# Patient Record
Sex: Female | Born: 1935 | Race: White | Hispanic: No | Smoking: Never smoker
Health system: Southern US, Community
[De-identification: ages and names within clinical notes are randomized; demographics above are authoritative.]

## PROBLEM LIST (undated history)

## (undated) DIAGNOSIS — E7800 Pure hypercholesterolemia, unspecified: Secondary | ICD-10-CM

## (undated) DIAGNOSIS — I10 Essential (primary) hypertension: Secondary | ICD-10-CM

## (undated) DIAGNOSIS — E119 Type 2 diabetes mellitus without complications: Secondary | ICD-10-CM

## (undated) DIAGNOSIS — F445 Conversion disorder with seizures or convulsions: Secondary | ICD-10-CM

## (undated) DIAGNOSIS — G629 Polyneuropathy, unspecified: Secondary | ICD-10-CM

## (undated) DIAGNOSIS — E785 Hyperlipidemia, unspecified: Secondary | ICD-10-CM

## (undated) DIAGNOSIS — F0390 Unspecified dementia without behavioral disturbance: Secondary | ICD-10-CM

## (undated) DIAGNOSIS — N289 Disorder of kidney and ureter, unspecified: Secondary | ICD-10-CM

## (undated) DIAGNOSIS — E559 Vitamin D deficiency, unspecified: Secondary | ICD-10-CM

## (undated) HISTORY — DX: Essential (primary) hypertension: I10

## (undated) HISTORY — DX: Polyneuropathy, unspecified: G62.9

## (undated) HISTORY — PX: ABDOMINAL HYSTERECTOMY: SHX81

## (undated) HISTORY — PX: APPENDECTOMY: SHX54

---

## 2017-04-17 ENCOUNTER — Ambulatory Visit: Payer: Medicare HMO | Admitting: Neurology

## 2017-04-17 ENCOUNTER — Encounter: Payer: Self-pay | Admitting: Neurology

## 2017-04-17 DIAGNOSIS — F0391 Unspecified dementia with behavioral disturbance: Secondary | ICD-10-CM

## 2017-04-17 DIAGNOSIS — F03B18 Unspecified dementia, moderate, with other behavioral disturbance: Secondary | ICD-10-CM

## 2017-04-17 MED ORDER — SERTRALINE HCL 100 MG PO TABS: 100.0000 mg | ORAL_TABLET | Freq: Every day | ORAL | 6 refills | Status: AC

## 2017-04-17 NOTE — Progress Notes (Signed)
NEUROLOGY CONSULTATION NOTE  Veronica Rivas MRN: [id number] DOB: 04-21-1927  Referring provider: Dr. Zannie Kehr Primary care provider: none listed  Reason for consult:  Establish care for dementia  Dear Dr Aileen Fass:  Thank you for your kind referral of Veronica Rivas for consultation of the above symptoms. Although her history is well known to you, please allow me to reiterate it for the purpose of our medical record. The patient was accompanied to the clinic by her daughter who also provides collateral information. Records and images were personally reviewed where available.  HISTORY OF PRESENT ILLNESS: This is an 83 year old right-handed woman with a history of hypertension, hyperlipidemia, neuropathy, and dementia, presenting to establish care. She moved to [facility] from [place] last March to be closer to her daughter. Records from her prior neurologist in [place] were reviewed. Family started noticing memory changes in 2013. She was evaluated in 2015, MOCA 22/30. MRI report from January 2015 noted chronic microvascular disease, no acute changes. She was started on Aricept. She had diarrhea on 10mg  dose, Namenda was added. In 2016, she was taking Aricept 10mg  daily and Namenda XR 21 mg daily. Her daughter reports diarrhea, she takes Imodium daily. MMSE 24/30. On her last visit in 2017, MMSE was 24/30. She was also noted to have mild neuropathy with wide-based gait and positive Romberg test. She is currently living is assisted living at [facility]. She feels her memory is "fine." Her daughter provides majority of history. Her daughter reports that prior to moving here, she was living alone but not making her self meals, medications were out of sync, she was forgetting to take them, and she has not been sleeping well. She has been in ALF for 6 months but has not been doing well. Her daughter expressed frustration about recent events. She moved to her current ALF because dogs were allowed. However, she has been  unable to care for the dog. Her room smelled of dog excrement, she would not take him out, but states that she already did. Two months ago, she was given an option to get rid of the dog or move to a different room without carpet. She decided to move, but the situation was unchanged. She was also calling her daughter several times a day to report she was moved to a new room. The dog situation got to the point where she had to let go of the dog, and last Friday she was told staff would adopt him. Dog was picked up the next day, and since then several family members have gotten calls up to 50 times a day looking for her dog. When reminded, she would start crying saying she had nothing to live for, threatening to kill herself. She denies suicidal ideation today. Her daughter expressed a lot of frustration with the patient, "she refuses to do anything." She has never been a social person, but refuses to try to join ALF activities. She refuses to use her walker. Staff has called her daughter twice in the past month, they found her on the floor in her room. She leaves her cane by the door. She wears the same clothes with stains. Staff leaves laundry on her bed, and it stays there. She refuses to bathe, and refuses help. She has hearing loss but hearing aids "were a disaster." She would fiddle with them and would not keep them on. She lost them one time and they later on found it in her room. Her daughter reports possible hallucinations. She  moved her bed by herself to the other side of the room because she reported hearing a humming noise from the wall. Maintenance checked and there was no humming sound noted. She only hears it at night. She does not wander at night.   She denies any headaches, dizziness, diplopia, dysarthria, dysphagia, neck/back pain, focal numbness/tingling/weakness, bowel/bladder dysfunction. No anosmia, tremors. She denies any significant head injuries. No family history of dementia. No alcohol use.     PAST MEDICAL HISTORY: Past Medical History:  Diagnosis Date  . Hypertension   . Neuropathy     PAST SURGICAL HISTORY: Past Surgical History:  Procedure Laterality Date  . ABDOMINAL HYSTERECTOMY    . APPENDECTOMY      MEDICATIONS: Current Outpatient Medications on File Prior to Visit  Medication Sig Dispense Refill  . ALPHA LIPOIC ACID PO Take by mouth.    Marland Kitchen. aspirin EC 81 MG tablet Take 81 mg by mouth daily.    . cyanocobalamin 1000 MCG tablet Take 1,000 mcg by mouth daily.    Marland Kitchen. donepezil (ARICEPT) 10 MG tablet Take 10 mg by mouth at bedtime.    . hydrochlorothiazide (HYDRODIURIL) 25 MG tablet Take 25 mg by mouth daily.    Marland Kitchen. lisinopril (PRINIVIL,ZESTRIL) 20 MG tablet Take 20 mg by mouth daily.    Marland Kitchen. loperamide (IMODIUM A-D) 2 MG tablet Take 2 mg by mouth 4 (four) times daily as needed for diarrhea or loose stools.    . Memantine HCl ER 21 MG CP24 Take by mouth.    . metoprolol tartrate (LOPRESSOR) 100 MG tablet Take 100 mg by mouth 2 (two) times daily.    . Multiple Vitamin (MULTIVITAMIN) tablet Take 1 tablet by mouth daily.    . simvastatin (ZOCOR) 10 MG tablet Take 10 mg by mouth daily.     No current facility-administered medications on file prior to visit.     ALLERGIES: Allergies not on file  FAMILY HISTORY: No family history on file.  SOCIAL HISTORY: Social History   Socioeconomic History  . Marital status: Widowed    Spouse name: Not on file  . Number of children: Not on file  . Years of education: Not on file  . Highest education level: Not on file  Social Needs  . Financial resource strain: Not on file  . Food insecurity - worry: Not on file  . Food insecurity - inability: Not on file  . Transportation needs - medical: Not on file  . Transportation needs - non-medical: Not on file  Occupational History  . Not on file  Tobacco Use  . Smoking status: Not on file  Substance and Sexual Activity  . Alcohol use: Not on file  . Drug use: Not on file    . Sexual activity: Not on file  Other Topics Concern  . Not on file  Social History Narrative  . Not on file    REVIEW OF SYSTEMS: Constitutional: No fevers, chills, or sweats, no generalized fatigue, change in appetite Eyes: No visual changes, double vision, eye pain Ear, nose and throat: No hearing loss, ear pain, nasal congestion, sore throat Cardiovascular: No chest pain, palpitations Respiratory:  No shortness of breath at rest or with exertion, wheezes GastrointestinaI: No nausea, vomiting, diarrhea, abdominal pain, fecal incontinence Genitourinary:  No dysuria, urinary retention or frequency Musculoskeletal:  No neck pain, back pain Integumentary: No rash, pruritus, skin lesions Neurological: as above Psychiatric: No depression, insomnia, anxiety Endocrine: No palpitations, fatigue, diaphoresis, mood swings, change in appetite,  change in weight, increased thirst Hematologic/Lymphatic:  No anemia, purpura, petechiae. Allergic/Immunologic: no itchy/runny eyes, nasal congestion, recent allergic reactions, rashes  PHYSICAL EXAM: There were no vitals filed for this visit. General: No acute distress Head:  Normocephalic/atraumatic Eyes: Fundoscopic exam shows bilateral sharp discs, no vessel changes, exudates, or hemorrhages Neck: supple, no paraspinal tenderness, full range of motion Back: No paraspinal tenderness Heart: regular rate and rhythm Lungs: Clear to auscultation bilaterally. Vascular: No carotid bruits. Skin/Extremities: No rash, no edema Neurological Exam: Mental status: alert and oriented to person, place, and time, no dysarthria or aphasia, Fund of knowledge is appropriate.  Recent and remote memory are intact.  Attention and concentration are normal.    Able to name objects and repeat phrases.  MMSE - Mini Mental State Exam 04/17/2017  Orientation to time 1  Orientation to Place 1  Registration 3  Attention/ Calculation 5  Recall 0  Language- name 2  objects 2  Language- repeat 1  Language- follow 3 step command 3  Language- read & follow direction 1  Write a sentence 1  Copy design 1  Total score 19   Cranial nerves: CN I: not tested CN II: pupils equal, round and reactive to light, visual fields intact, fundi unremarkable. CN III, IV, VI:  full range of motion, no nystagmus, no ptosis CN V: facial sensation intact CN VII: upper and lower face symmetric CN VIII: hearing intact to finger rub CN IX, X: gag intact, uvula midline CN XI: sternocleidomastoid and trapezius muscles intact CN XII: tongue midline Bulk & Tone: normal, no fasciculations. Motor: 5/5 throughout with no pronator drift. Sensation: intact to light touch, cold, pin. Decreased vibration to right ankle. No extinction to double simultaneous stimulation.  Romberg test positive Deep Tendon Reflexes: +2 throughout except for absent ankle jerks bilaterally, no ankle clonus Plantar responses: downgoing bilaterally Cerebellar: no incoordination on finger to nose, heel to shin. No dysdiadochokinesia Gait: wide-based with cane, unable to tandem walk Tremor: none  IMPRESSION: This is an 83 year old right-handed woman with a history of hypertension, hyperlipidemia, neuropathy, and moderate dementia with behavioral disturbance. MMSE today 19/30. She is on Aricept 10mg  daily and Namenda XR 21 mg daily, with frequent diarrhea. Her daughter's main concern has been the behavioral response to losing her dog. Increase Sertraline to 100mg  qhs. We may add on low dose Seroquel if needed. Agree with plan for clinical psychologist visits at ALF. Her family has had difficulties with the diagnosis, they will be connected to [employer] through the [employer] for 24/7 support. At this point, would recommend higher level of care, moving to Memory Care would be most beneficial. She will follow-up in 6 months and knows to call for any changes.   Thank you for allowing me to  participate in the care of this patient. Please do not hesitate to call for any questions or concerns.   Patrcia DollyKaren Brinna Divelbiss, M.D.  CC: [facility], Dr. Aileen FassHou

## 2017-04-17 NOTE — Patient Instructions (Signed)
1. Increase Sertraline to 100mg  at bedtime 2. Continue Aricept and Namenda 3. Proceed with seeing psychologist for depression 4. We will connect you with [employer] through the [employer] to help with support and questions 5. Follow-up in 6 months, call for any changes  FALL PRECAUTIONS: Be cautious when walking. Scan the area for obstacles that may increase the risk of trips and falls. When getting up in the mornings, sit up at the edge of the bed for a few minutes before getting out of bed. Consider elevating the bed at the head end to avoid drop of blood pressure when getting up. Walk always in a well-lit room (use night lights in the walls). Avoid area rugs or power cords from appliances in the middle of the walkways. Use a walker or a cane if necessary and consider physical therapy for balance exercise. Get your eyesight checked regularly.  FINANCIAL OVERSIGHT: Supervision, especially oversight when making financial decisions or transactions is also recommended.  HOME SAFETY: Consider the safety of the kitchen when operating appliances like stoves, microwave oven, and blender. Consider having supervision and share cooking responsibilities until no longer able to participate in those. Accidents with firearms and other hazards in the house should be identified and addressed as well.  DRIVING: Regarding driving, in patients with progressive memory problems, driving will be impaired. We advise to have someone else do the driving if trouble finding directions or if minor accidents are reported. Independent driving assessment is available to determine safety of driving.  ABILITY TO BE LEFT ALONE: If patient is unable to contact 911 operator, consider using LifeLine, or when the need is there, arrange for someone to stay with patients. Smoking is a fire hazard, consider supervision or cessation. Risk of wandering should be assessed by caregiver and if detected at any point, supervision and  safe proof recommendations should be instituted.  MEDICATION SUPERVISION: Inability to self-administer medication needs to be constantly addressed. Implement a mechanism to ensure safe administration of the medications.  RECOMMENDATIONS FOR ALL PATIENTS WITH MEMORY PROBLEMS: 1. Continue to exercise (Recommend 30 minutes of walking everyday, or 3 hours every week) 2. Increase social interactions - continue going to [place] and enjoy social gatherings with friends and family 3. Eat healthy, avoid fried foods and eat more fruits and vegetables 4. Maintain adequate blood pressure, blood sugar, and blood cholesterol level. Reducing the risk of stroke and cardiovascular disease also helps promoting better memory. 5. Avoid stressful situations. Live a simple life and avoid aggravations. Organize your time and prepare for the next day in anticipation. 6. Sleep well, avoid any interruptions of sleep and avoid any distractions in the bedroom that may interfere with adequate sleep quality 7. Avoid sugar, avoid sweets as there is a strong link between excessive sugar intake, diabetes, and cognitive impairment We discussed the Mediterranean diet, which has been shown to help patients reduce the risk of progressive memory disorders and reduces cardiovascular risk. This includes eating fish, eat fruits and green leafy vegetables, nuts like almonds and hazelnuts, walnuts, and also use olive oil. Avoid fast foods and fried foods as much as possible. Avoid sweets and sugar as sugar use has been linked to worsening of memory function.  There is always a concern of gradual progression of memory problems. If this is the case, then we may need to adjust level of care according to patient needs. Support, both to the patient and caregiver, should then be put into place.

## 2017-04-19 ENCOUNTER — Encounter: Payer: Self-pay | Admitting: Neurology

## 2017-04-19 DIAGNOSIS — F03918 Unspecified dementia, unspecified severity, with other behavioral disturbance: Secondary | ICD-10-CM | POA: Insufficient documentation

## 2017-04-19 DIAGNOSIS — F0391 Unspecified dementia with behavioral disturbance: Secondary | ICD-10-CM | POA: Insufficient documentation

## 2017-04-21 ENCOUNTER — Emergency Department (HOSPITAL_COMMUNITY): Payer: Medicare HMO

## 2017-04-21 ENCOUNTER — Emergency Department (HOSPITAL_COMMUNITY)
Admission: EM | Admit: 2017-04-21 | Discharge: 2017-04-23 | Disposition: A | Discharge status: Home / Self Care | Payer: Medicare HMO | Attending: Emergency Medicine | Admitting: Emergency Medicine

## 2017-04-21 ENCOUNTER — Encounter (HOSPITAL_COMMUNITY): Payer: Self-pay

## 2017-04-21 DIAGNOSIS — G309 Alzheimer's disease, unspecified: Secondary | ICD-10-CM | POA: Diagnosis not present

## 2017-04-21 DIAGNOSIS — I10 Essential (primary) hypertension: Secondary | ICD-10-CM | POA: Insufficient documentation

## 2017-04-21 DIAGNOSIS — F0391 Unspecified dementia with behavioral disturbance: Secondary | ICD-10-CM

## 2017-04-21 DIAGNOSIS — Z79899 Other long term (current) drug therapy: Secondary | ICD-10-CM | POA: Insufficient documentation

## 2017-04-21 DIAGNOSIS — F0281 Dementia in other diseases classified elsewhere with behavioral disturbance: Secondary | ICD-10-CM | POA: Diagnosis not present

## 2017-04-21 DIAGNOSIS — R45851 Suicidal ideations: Secondary | ICD-10-CM | POA: Insufficient documentation

## 2017-04-21 DIAGNOSIS — F03918 Unspecified dementia, unspecified severity, with other behavioral disturbance: Secondary | ICD-10-CM | POA: Diagnosis present

## 2017-04-21 DIAGNOSIS — R451 Restlessness and agitation: Secondary | ICD-10-CM | POA: Diagnosis present

## 2017-04-21 DIAGNOSIS — F419 Anxiety disorder, unspecified: Secondary | ICD-10-CM | POA: Diagnosis not present

## 2017-04-21 LAB — CBC WITH DIFFERENTIAL/PLATELET
BASOS PCT: 0 %
Basophils Absolute: 0 10*3/uL (ref 0.0–0.1)
EOS ABS: 0.1 10*3/uL (ref 0.0–0.7)
EOS PCT: 1 %
HCT: 42.1 % (ref 36.0–46.0)
Hemoglobin: 13.4 g/dL (ref 12.0–15.0)
Lymphocytes Relative: 14 %
Lymphs Abs: 1.6 10*3/uL (ref 0.7–4.0)
MCH: 31.3 pg (ref 26.0–34.0)
MCHC: 31.8 g/dL (ref 30.0–36.0)
MCV: 98.4 fL (ref 78.0–100.0)
MONO ABS: 1.2 10*3/uL — AB (ref 0.1–1.0)
MONOS PCT: 11 %
Neutro Abs: 8.7 10*3/uL — ABNORMAL HIGH (ref 1.7–7.7)
Neutrophils Relative %: 74 %
PLATELETS: 195 10*3/uL (ref 150–400)
RBC: 4.28 MIL/uL (ref 3.87–5.11)
RDW: 13.5 % (ref 11.5–15.5)
WBC: 11.7 10*3/uL — ABNORMAL HIGH (ref 4.0–10.5)

## 2017-04-21 LAB — URINALYSIS, ROUTINE W REFLEX MICROSCOPIC
BILIRUBIN URINE: NEGATIVE
Glucose, UA: NEGATIVE mg/dL
HGB URINE DIPSTICK: NEGATIVE
KETONES UR: NEGATIVE mg/dL
Nitrite: NEGATIVE
PROTEIN: NEGATIVE mg/dL
SPECIFIC GRAVITY, URINE: 1.014 (ref 1.005–1.030)
pH: 5 (ref 5.0–8.0)

## 2017-04-21 LAB — COMPREHENSIVE METABOLIC PANEL
ALBUMIN: 4.2 g/dL (ref 3.5–5.0)
ALT: 21 U/L (ref 14–54)
ANION GAP: 8 (ref 5–15)
AST: 29 U/L (ref 15–41)
Alkaline Phosphatase: 96 U/L (ref 38–126)
BILIRUBIN TOTAL: 0.7 mg/dL (ref 0.3–1.2)
BUN: 40 mg/dL — ABNORMAL HIGH (ref 6–20)
CO2: 24 mmol/L (ref 22–32)
CREATININE: 1.44 mg/dL — AB (ref 0.44–1.00)
Calcium: 9.9 mg/dL (ref 8.9–10.3)
Chloride: 108 mmol/L (ref 101–111)
GFR calc Af Amer: 36 mL/min — ABNORMAL LOW (ref >60)
GFR calc non Af Amer: 31 mL/min — ABNORMAL LOW (ref >60)
GLUCOSE: 103 mg/dL — AB (ref 65–99)
Potassium: 4.2 mmol/L (ref 3.5–5.1)
SODIUM: 140 mmol/L (ref 135–145)
TOTAL PROTEIN: 7.3 g/dL (ref 6.5–8.1)

## 2017-04-21 LAB — I-STAT CG4 LACTIC ACID, ED: Lactic Acid, Venous: 1.7 mmol/L (ref 0.5–1.9)

## 2017-04-21 LAB — I-STAT TROPONIN, ED: Troponin i, poc: 0.01 ng/mL (ref 0.00–0.08)

## 2017-04-21 MED ORDER — DONEPEZIL HCL 5 MG PO TABS
10.0000 mg | ORAL_TABLET | Freq: Every day | ORAL | Status: DC
  Administered 2017-04-21 – 2017-04-22 (×2): 10 mg via ORAL
  Filled 2017-04-21 (×2): qty 2

## 2017-04-21 MED ORDER — LOPERAMIDE HCL 2 MG PO CAPS: 2.0000 mg | ORAL_CAPSULE | Freq: Four times a day (QID) | ORAL | Status: DC | PRN

## 2017-04-21 MED ORDER — HYDROCHLOROTHIAZIDE 25 MG PO TABS: 25.0000 mg | ORAL_TABLET | Freq: Every day | ORAL | Status: DC

## 2017-04-21 MED ORDER — SERTRALINE HCL 50 MG PO TABS
100.0000 mg | ORAL_TABLET | Freq: Every day | ORAL | Status: DC
  Administered 2017-04-21 – 2017-04-22 (×2): 100 mg via ORAL
  Filled 2017-04-21 (×2): qty 2

## 2017-04-21 MED ORDER — METOPROLOL TARTRATE 25 MG PO TABS
100.0000 mg | ORAL_TABLET | Freq: Two times a day (BID) | ORAL | Status: DC
  Administered 2017-04-21 – 2017-04-22 (×3): 100 mg via ORAL
  Filled 2017-04-21 (×5): qty 4

## 2017-04-21 MED ORDER — RISPERIDONE 0.5 MG PO TABS
0.5000 mg | ORAL_TABLET | Freq: Two times a day (BID) | ORAL | Status: DC
  Administered 2017-04-21 – 2017-04-23 (×4): 0.5 mg via ORAL
  Filled 2017-04-21 (×4): qty 1

## 2017-04-21 MED ORDER — ASPIRIN EC 81 MG PO TBEC: 81.0000 mg | DELAYED_RELEASE_TABLET | Freq: Every day | ORAL | Status: DC

## 2017-04-21 MED ORDER — MEMANTINE HCL ER 21 MG PO CP24
21.0000 mg | ORAL_CAPSULE | Freq: Every day | ORAL | Status: DC
  Administered 2017-04-22 – 2017-04-23 (×2): 21 mg via ORAL
  Filled 2017-04-21 (×3): qty 24

## 2017-04-21 MED ORDER — LORAZEPAM 0.5 MG PO TABS
0.5000 mg | ORAL_TABLET | Freq: Once | ORAL | Status: AC
  Administered 2017-04-21: 0.5 mg via ORAL
  Filled 2017-04-21: qty 1

## 2017-04-21 MED ORDER — LISINOPRIL 20 MG PO TABS
20.0000 mg | ORAL_TABLET | Freq: Every day | ORAL | Status: DC
  Administered 2017-04-22 – 2017-04-23 (×2): 20 mg via ORAL
  Filled 2017-04-21 (×3): qty 1

## 2017-04-21 MED ORDER — SIMVASTATIN 10 MG PO TABS
10.0000 mg | ORAL_TABLET | Freq: Every day | ORAL | Status: DC
  Administered 2017-04-21 – 2017-04-22 (×2): 10 mg via ORAL
  Filled 2017-04-21 (×2): qty 1

## 2017-04-21 NOTE — ED Provider Notes (Signed)
[facility]-EMERGENCY DEPT Provider Note   CSN: [id number] Arrival date & time: 04/21/17  1241     History   Chief Complaint Chief Complaint  Patient presents with  . Altered Mental Status    HPI Veronica ParishDolores Rivas is a 83 y.o. female.  HPI   Daughter sent pt to ED for evaluation of increasing agitation in the setting of delirium and her taking her dog away, specifically with concern she has made threats of killing herself and others.   25-50 times a day 5 times per minute calling her daughter, call sister Sometimes she would be ok and ask about where the dog was, sometimes would [place] Saw Neurologist, saw psychologist Psychologist prescribed zoloft, was first dose last night Last night threatened Veronica SorrowOlga who is taking care of her dog, said "I want to kill you" and started to go after her and she stepped aside.  Threatening to take own life, says "I am going to walk into the woods and let the animals get me"  Reports people "are stealing my stuff left and right" Sometimes she is only talking about where the dog is Sometimes she will leave messages repeating "I am going to kill myself" over and over  Appears she is not drinking anything other than what she is taking at meals as they have not had to refill   Does not have access to medications herself, no access to sharp objects or guns.   Patient denies SI, reports the daughter is lying  Veronica Standardllison is Interior and spatial designerdirector at [employer], has been in constant contact with her and she has checked on her.   Called 237 times this week.  Has no hx of depression or suicide attempts  Had threatened it in the past when they were talking about putting her in assisted living.    Past Medical History:  Diagnosis Date  . Hypertension   . Neuropathy     Patient Active Problem List   Diagnosis Date Noted  . Moderate dementia with behavioral disturbance 04/19/2017    Past Surgical History:  Procedure Laterality Date    . ABDOMINAL HYSTERECTOMY    . APPENDECTOMY      OB History    No data available       Home Medications    Prior to Admission medications   Medication Sig Start Date End Date Taking? Authorizing Provider  cyanocobalamin 1000 MCG tablet Take 1,000 mcg by mouth daily.   Yes [provider]  donepezil (ARICEPT) 10 MG tablet Take 10 mg by mouth at bedtime.   Yes [provider]  lisinopril (PRINIVIL,ZESTRIL) 20 MG tablet Take 20 mg by mouth daily.   Yes [provider]  loperamide (IMODIUM A-D) 2 MG tablet Take 2 mg by mouth 4 (four) times daily as needed for diarrhea or loose stools.   Yes [provider]  Memantine HCl ER 21 MG CP24 Take by mouth.   Yes [provider]  metoprolol tartrate (LOPRESSOR) 100 MG tablet Take 100 mg by mouth 2 (two) times daily.   Yes [provider]  Multiple Vitamin (MULTIVITAMIN) tablet Take 1 tablet by mouth daily.   Yes [provider]  potassium chloride SA (K-DUR,KLOR-CON) 20 MEQ tablet Take 20 mEq by mouth daily.   Yes [provider]  sertraline (ZOLOFT) 100 MG tablet Take 1 tablet (100 mg total) by mouth at bedtime. 04/17/17  Yes Van ClinesAquino, Karen M, MD  simvastatin (ZOCOR) 10 MG tablet Take 10 mg  by mouth daily.   Yes [provider]    Family History History reviewed. No pertinent family history.  Social History Social History   Tobacco Use  . Smoking status: Never Smoker  . Smokeless tobacco: Never Used  Substance Use Topics  . Alcohol use: No    Frequency: Never  . Drug use: No     Allergies   Patient has no known allergies.   Review of Systems Review of Systems  Unable to perform ROS: Dementia  Constitutional: Negative for fever.  HENT: Negative for sore throat.   Eyes: Negative for visual disturbance.  Respiratory: Negative for cough and shortness of breath.   Cardiovascular: Negative for chest pain.  Gastrointestinal: Negative for abdominal  pain.  Genitourinary: Negative for difficulty urinating.  Musculoskeletal: Negative for back pain and neck pain.  Skin: Negative for rash.  Neurological: Negative for syncope and headaches.  Psychiatric/Behavioral: Positive for suicidal ideas (per daughter).     Physical Exam Updated Vital Signs BP (!) 181/65 (BP Location: Left Arm)   Pulse 66   Temp 97.8 F (36.6 C) (Oral)   Resp 19   SpO2 100%   Physical Exam  Constitutional: She is oriented to person, place, and time. She appears well-developed and well-nourished. No distress.  HENT:  Head: Normocephalic and atraumatic.  Eyes: Conjunctivae and EOM are normal.  Neck: Normal range of motion.  Cardiovascular: Normal rate, regular rhythm, normal heart sounds and intact distal pulses. Exam reveals no gallop and no friction rub.  No murmur heard. Pulmonary/Chest: Effort normal and breath sounds normal. No respiratory distress. She has no wheezes. She has no rales.  Abdominal: Soft. She exhibits no distension. There is no tenderness. There is no guarding.  Musculoskeletal: She exhibits no edema or tenderness.  Neurological: She is alert and oriented to person, place, and time. She has normal strength. No cranial nerve deficit or sensory deficit. Coordination normal. GCS eye subscore is 4. GCS verbal subscore is 5. GCS motor subscore is 6.  Skin: Skin is warm and dry. No rash noted. She is not diaphoretic. No erythema.  Nursing note and vitals reviewed.    ED Treatments / Results  Labs (all labs ordered are listed, but only abnormal results are displayed) Labs Reviewed  CBC WITH DIFFERENTIAL/PLATELET - Abnormal; Notable for the following components:      Result Value   WBC 11.7 (*)    Neutro Abs 8.7 (*)    Monocytes Absolute 1.2 (*)    All other components within normal limits  COMPREHENSIVE METABOLIC PANEL - Abnormal; Notable for the following components:   Glucose, Bld 103 (*)    BUN 40 (*)    Creatinine, Ser 1.44 (*)     GFR calc non Af Amer 31 (*)    GFR calc Af Amer 36 (*)    All other components within normal limits  URINALYSIS, ROUTINE W REFLEX MICROSCOPIC - Abnormal; Notable for the following components:   Leukocytes, UA TRACE (*)    Bacteria, UA RARE (*)    Squamous Epithelial / LPF 0-5 (*)    All other components within normal limits  URINE CULTURE  I-STAT CG4 LACTIC ACID, ED  I-STAT TROPONIN, ED  I-STAT CG4 LACTIC ACID, ED    EKG  EKG Interpretation  Date/Time:  Saturday April 21 2017 14:53:15 EST Ventricular Rate:  64 PR Interval:    QRS Duration: 104 QT Interval:  436 QTC Calculation: 450 R Axis:   15 Text Interpretation:  Sinus rhythm No previous ECGs available Confirmed by Alvira Monday [id number]) on 04/21/2017 7:11:51 PM       Radiology Dg Chest 2 View  Result Date: 04/21/2017 CLINICAL DATA:  Altered mental status. EXAM: CHEST  2 VIEW COMPARISON:  None. FINDINGS: The cardiomediastinal silhouette is normal in size. Normal pulmonary vascularity. Atherosclerotic calcification of the aortic arch. No focal consolidation, pleural effusion, or pneumothorax. Mildly exaggerated thoracic kyphosis. No acute osseous abnormality. IMPRESSION: 1.  No active cardiopulmonary disease. 2.  Aortic atherosclerosis (ICD10-I70.0). Electronically Signed   By: Obie Dredge M.D.   On: 04/21/2017 14:34    Procedures Procedures (including critical care time)  Medications Ordered in ED Medications  risperiDONE (RISPERDAL) tablet 0.5 mg (not administered)  donepezil (ARICEPT) tablet 10 mg (not administered)  lisinopril (PRINIVIL,ZESTRIL) tablet 20 mg (not administered)  loperamide (IMODIUM A-D) tablet 2 mg (not administered)  Memantine HCl ER CP24 21 mg (not administered)  metoprolol tartrate (LOPRESSOR) tablet 100 mg (not administered)  sertraline (ZOLOFT) tablet 100 mg (not administered)  simvastatin (ZOCOR) tablet 10 mg (not administered)  LORazepam (ATIVAN) tablet 0.5 mg (0.5 mg Oral Given  04/21/17 1734)     Initial Impression / Assessment and Plan / ED Course  I have reviewed the triage vital signs and the nursing notes.  Pertinent labs & imaging results that were available during my care of the patient were reviewed by me and considered in my medical decision making (see chart for details).     83 year old female with a history of hypertension and dementia presents with concern for increasing agitation, suicidal threats, and threats towards others.  Patient with normal neurologic exam, no signs of CVA.  She has no history of head trauma, signs of head trauma, denies headache, low suspicion for bleed.  Labs show no significant electrolyte abnormalities, no significant anemia.  Lactic acid WNL.  Troponin WNL. EKG without signfiicant findings. Chest XR without pneumonia or CHF.  Patient reports she does not know why she is here, and that she feels fine.  She denies suicidal ideation when asked her, however on daughter's history, she reports that the patient has been calling her up to 50 times a day, and repeating over and over that she wants to kill herself.  Daughter also reports that she had a plan of walking into the woods until with animals get her.  Daughter also reports that the patient threatened the woman Veronica Rivas, who is been taking care of the patient's dog, and threatened that she would kill Veronica Rivas, and began to lunch at her at the facility prior to [place] stepping aside.    Patient medically cleared. Symptoms likely secondary to advancing dementia with agitation, behavioral disturbance, and given patient reporting suicidal ideation frequently to daughter and threatening other's at facility we do not feel comfortable with her returning back to [place].  Placed IVC paperwork.  Patient medically cleared. TTS consulted. Awaiting Geripsych placement.  Ordered home meds in addition to risperdal.   Final Clinical Impressions(s) / ED Diagnoses   Final diagnoses:  Alzheimer's dementia  with behavioral disturbance, unspecified timing of dementia onset  Suicidal ideation    ED Discharge Orders    None       Alvira Monday, MD 04/21/17 1912

## 2017-04-21 NOTE — ED Notes (Signed)
Patient much calmer now.  Not crying eating some of her dinner.

## 2017-04-21 NOTE — ED Notes (Signed)
Patient tearful, angry, hard of hearing.  Patient says she cannot hear nurse because she doesn't have her hearing aids.

## 2017-04-21 NOTE — ED Notes (Signed)
Pt. Wanted the phone to call her daughter, Veronica Rivas. After pt. Got off the phone the daughter calls [facility] and spoke with secretary, Misty StanleyLisa, stating that "her mother is threatening to kill herself if someone does not come pick her up". MD Schlossman made aware.

## 2017-04-21 NOTE — ED Notes (Signed)
Spoke with pt. Daughter who [place] in [place]. Daughter gave information about her mom having dementia.

## 2017-04-21 NOTE — ED Notes (Signed)
MD contacted because patient sobbing in her room getting very upset.  MD ordered a one time dose of ativan 0.5 po.

## 2017-04-21 NOTE — ED Notes (Signed)
Patient crying now saying, "where is my dog?"  Tech reminded patient that her daughter has her dog taking care of it.

## 2017-04-21 NOTE — ED Triage Notes (Signed)
[facility]. [facility]. Pt. Having irceased confusion over the past few days and agitated. Worse today. Incontinent more then usual. Urinated in her bed today witch is unusual. Her daughter had to take her dog away from her because she couldn't care for it. Pt. Making SI comments to her daughter today and staff last night. Pt. Feels hot to touch via EMS. Pt. Hard of hearing.

## 2017-04-21 NOTE — ED Notes (Signed)
Bed: [id number] Expected date: 04/21/17 Expected time: 12:29 PM Means of arrival: Ambulance Comments: ? UTI from Nsg home, AMS

## 2017-04-21 NOTE — BH Assessment (Addendum)
Assessment Note  Veronica Rivas is an 83 y.o. female that presents this date with a altered mental mental state. Patient has a history of dementia and has [facility] residing at [facility] since March [facility]. Patient is hearing impaired and has difficulty interacting with this Clinical research associatewriter. Patient is observed to be tearful and anxious as this Clinical research associatewriter attempts to conduct assessment. Patient is not participating in the assessment and cannot render any history due to current menta state. Patient is observed to be unaware of why she initially presented. Information for purposes of completing assessment was obtained from attending EDP Schlossman MD. This writer also attempted to contact daughter Godfrey PickLinda Fries [phone] who was unable to be reached at this time. Per notes, patient has a history of dementia. Earlier note from this date states patient attempted to contact her daughter making statements of self harm, this was verified by EDP who actually spoke to daughter earlier. Per notes , "After patient contacted her daughter per notes, daughter called [facility] and spoke with secretary, Misty StanleyLisa, stating that "her mother is threatening to kill herself if someone does not come pick her up". MD Dalene SeltzerSchlossman was informed of phone call".  Admission note stated: "[facility] [place]. Patient was having inrceased confusion over the past few days and agitated. Patient is upset due to her daughter having to take her dog away from her because she couldn't care for it. Patient was making SI comments to her daughter today and staff last night. [place] to be closer to her daughter. Records from [place] prior neurologist in [place] were reviewed. Family started noticing memory changes in 2013. Her daughter reports that prior to moving here, patient was living alone but not making her self meals and was not compliant with her medication regimen. Patient was  also calling her daughter up to 50 times a day looking for her dog. When reminded, she would start crying saying she had nothing to live for, threatening to kill herself. She denies suicidal ideation today. Her daughter expressed a lot of frustration with the patient, "she refuses to do anything." Patient moved her bed by herself to the other side of the room because she reported hearing a humming noise from the wall. Maintenance checked and there was no humming sound noted. She only hears it at night. She does not wander at night". Case was staffed with Dalene SeltzerSchlossman MD who initiated a IVC to assist with stabilization and monitor patient for safety. Case was staffed with Shaune PollackLord DNP who recommended a inpatient admission (Geropsychiatry) and concurred with Schlossman MD after clinical review that a IVC was necessary. Patient will be admitted inpatient as appropriate placement is investigated.     Diagnosis: F03.90 Dementia  Past Medical History:  Past Medical History:  Diagnosis Date  . Hypertension   . Neuropathy     Past Surgical History:  Procedure Laterality Date  . ABDOMINAL HYSTERECTOMY    . APPENDECTOMY      Family History: History reviewed. No pertinent family history.  Social History:  reports that  has never smoked. she has never used smokeless tobacco. She reports that she does not drink alcohol or use drugs.  Additional Social History:  Alcohol / Drug Use Pain Medications: See MAR Prescriptions: See MAR Over the Counter: See MAR History of alcohol / drug use?: No history of alcohol / drug abuse Longest period of sobriety (when/how long): NA Negative Consequences of  Use: (S) (Denies) Withdrawal Symptoms: (Denies)  CIWA: CIWA-Ar BP: (!) 170/63 Pulse Rate: 65 COWS:    Allergies: No Known Allergies  Home Medications:  (Not in a hospital admission)  OB/GYN Status:  No LMP recorded.  General Assessment Data [facility] of Assessment: [facility] T[facility] Assessment: In system Is this  a Tele or Face-to-Face Assessment?: Face-to-Face Is this an Initial Assessment or a Re-assessment for this encounter?: Initial Assessment Marital status: Widowed [place] name: NA Is patient pregnant?: No Pregnancy Status: No Living Arrangements: Other (Comment)(Assited living community) Can pt return to current living arrangement?: Yes Admission Status: Involuntary Is patient capable of signing voluntary admission?: No Referral Source: Other Insurance type: Youth worker Exam [facility] Walk-in ONLY) Medical Exam completed: Yes  Crisis Care Plan Living Arrangements: Other (Comment)(Assited living community) Legal Guardian: Other:(NA) Name of Psychiatrist: [facility] Name of Therapist: [facility]  Education Status Is patient currently in school?: No Current Grade: NA Highest grade of school patient has completed: 12 Name of school: NA Contact person: NA  Risk to self with the past 6 months Suicidal Ideation: (UTA) Has patient been a risk to self within the past 6 months prior to admission? : (UTA) Suicidal Intent: (UTA) Has patient had any suicidal intent within the past 6 months prior to admission? : (UTA) Is patient at risk for suicide?: (UTA) Suicidal Plan?: (UTA) Has patient had any suicidal plan within the past 6 months prior to admission? : (UTA) Access to Means: No What has been your use of drugs/alcohol within the last 12 months?: Denies Previous Attempts/Gestures: No How many times?: 0 Other Self Harm Risks: (NA) Triggers for Past Attempts: Other (Comment)(Stress from current situation at facility ) Intentional Self Injurious Behavior: None Family Suicide History: No Recent stressful life event(s): Other (Comment)(Stress of current living situation) Persecutory voices/beliefs?: No Depression: (UTA) Depression Symptoms: (UTA) Substance abuse history and/or treatment for substance abuse?: No Suicide prevention information given to non-admitted  patients: Not applicable  Risk to Others within the past 6 months Homicidal Ideation: No Does patient have any lifetime risk of violence toward others beyond the six months prior to admission? : No Thoughts of Harm to Others: (Per daughter pt made threats to staff) Current Homicidal Intent: (UTA) Current Homicidal Plan: (UTA) Access to Homicidal Means: No Identified Victim: Counselling psychologist at facility) History of harm to others?: No Assessment of Violence: (Per daughter threats to staff) Violent Behavior Description: (Threats to staff per daughter) Does patient have access to weapons?: No Criminal Charges Pending?: No Does patient have a court date: No Is patient on probation?: No  Psychosis Hallucinations: None noted Delusions: None noted  Mental Status Report Appearance/Hygiene: Unremarkable Eye Contact: Poor Motor Activity: Agitation Speech: Soft, Slow Level of Consciousness: Irritable Mood: Irritable Affect: Angry Anxiety Level: Moderate Thought Processes: Unable to Assess Judgement: Unable to Assess Orientation: Unable to assess Obsessive Compulsive Thoughts/Behaviors: Unable to Assess  Cognitive Functioning Concentration: Unable to Assess Memory: Unable to Assess IQ: Average Insight: Unable to Assess Impulse Control: Unable to Assess Appetite: Fair Weight Loss: 0 Weight Gain: 0 Sleep: No Change Total Hours of Sleep: 6 Vegetative Symptoms: None  ADLScreening [facility] Assessment Services) Patient's cognitive ability adequate to safely complete daily activities?: No Patient able to express need for assistance with ADLs?: Yes Independently performs ADLs?: No  Prior Inpatient Therapy Prior Inpatient Therapy: No Prior Therapy Dates: NA Prior Therapy Facilty/Provider(s): NA Reason for Treatment: NA  Prior Outpatient Therapy Prior Outpatient Therapy: No Prior Therapy Dates: NA  Prior Therapy Facilty/Provider(s): NA Reason for Treatment: NA Does patient have an ACCT  team?: No Does patient have Intensive In-House Services?  : No Does patient have [facility] services? : No Does patient have P4CC services?: No  ADL Screening (condition at time of admission) Patient's cognitive ability adequate to safely complete daily activities?: No Is the patient deaf or have difficulty hearing?: Yes Does the patient have difficulty seeing, even when wearing glasses/contacts?: Yes Does the patient have difficulty concentrating, remembering, or making decisions?: Yes Patient able to express need for assistance with ADLs?: Yes Does the patient have difficulty dressing or bathing?: Yes Independently performs ADLs?: No Communication: Independent Dressing (OT): Needs assistance Is this a change from baseline?: Pre-admission baseline Grooming: Needs assistance Is this a change from baseline?: Pre-admission baseline Feeding: Independent Bathing: Needs assistance Is this a change from baseline?: Pre-admission baseline Toileting: Independent In/Out Bed: Independent Walks in Home: Independent Does the patient have difficulty walking or climbing stairs?: Yes Weakness of Legs: None Weakness of Arms/Hands: None  Home Assistive Devices/Equipment Home Assistive Devices/Equipment: None  Therapy Consults (therapy consults require a physician order) PT Evaluation Needed: No OT Evalulation Needed: No SLP Evaluation Needed: No Abuse/Neglect Assessment (Assessment to be complete while patient is alone) Physical Abuse: Denies Verbal Abuse: Denies Sexual Abuse: Denies Exploitation of patient/patient's resources: Denies Self-Neglect: Denies Values / Beliefs Cultural Requests During Hospitalization: None Spiritual Requests During Hospitalization: None Consults Spiritual Care Consult Needed: No Social Work Consult Needed: No Merchant navy officerAdvance Directives (For Healthcare) Does Patient Have a Medical Advance Directive?: Yes Does patient want to make changes to medical advance directive?:  No - Patient declined Type of Advance Directive: Living will Copy of Living Will in Chart?: No - copy requested Would patient like information on creating a medical advance directive?: No - Patient declined    Additional Information 1:1 In Past 12 Months?: No CIRT Risk: No Elopement Risk: No Does patient have medical clearance?: Yes     Disposition:  Case was staffed with Shaune PollackLord DNP who recommended a inpatient admission (Geropsychiatry) and concurred with Schlossman MD after clinical review that a IVC was necessary. Patient will be admitted inpatient as appropriate placement is investigated.    Disposition Initial Assessment Completed for this Encounter: Yes Disposition of Patient: Inpatient treatment program Type of inpatient treatment program: Adult(Gero Psych)  On Site Evaluation by:   Reviewed with Physician:    Alfredia Fergusonavid L Syana Degraffenreid 04/21/2017 5:35 PM

## 2017-04-22 DIAGNOSIS — G309 Alzheimer's disease, unspecified: Secondary | ICD-10-CM | POA: Diagnosis not present

## 2017-04-22 DIAGNOSIS — R45851 Suicidal ideations: Secondary | ICD-10-CM | POA: Diagnosis not present

## 2017-04-22 DIAGNOSIS — R450 Nervousness: Secondary | ICD-10-CM | POA: Diagnosis not present

## 2017-04-22 DIAGNOSIS — F0281 Dementia in other diseases classified elsewhere with behavioral disturbance: Secondary | ICD-10-CM | POA: Diagnosis not present

## 2017-04-22 DIAGNOSIS — F419 Anxiety disorder, unspecified: Secondary | ICD-10-CM

## 2017-04-22 DIAGNOSIS — F0391 Unspecified dementia with behavioral disturbance: Secondary | ICD-10-CM

## 2017-04-22 LAB — URINE CULTURE

## 2017-04-22 NOTE — BH Assessment (Signed)
[facility] Assessment Progress Note    Per Dr. Lolly MustacheArfeen, patient will need Gero-Psych Placement.  TTS has fxed referrals and awaiting responses from referral facilities.

## 2017-04-22 NOTE — BH Assessment (Signed)
[facility] Assessment Progress Note     TTS Faxed referral packets to the following facilities: [facility], [facility], [facility], [address] [facility] [facility], [facility], [facility], [facility], [facility], [facility], [place].

## 2017-04-22 NOTE — Consult Note (Signed)
[facility] Consult   Reason for Consult:  Depression and suicidal ideations Referring Physician:  EDP Patient Identification: Veronica Rivas MRN:  [id number] Principal Diagnosis: dementia with behavioral disturbance Diagnosis:   Patient Active Problem List   Diagnosis Date Noted  . Moderate dementia with behavioral disturbance [F03.91] 04/19/2017    Total Time spent with patient: 45 minutes  Subjective:   Veronica Rivas is a 83 y.o. female patient admitted with suicide risk.  HPI:  83 yo female who presented to the ED with suicidal ideations and threats to kill herself.  Her dog was recently taken as she went to a facility and she has been focused on this.  She has been making suicidal comments to staff and her daughter prior to admission but none on assessment in the ED.  Her daughters are frustrated with her because she won't do anything.  Some paranoia prior to admission about hearing a "humming" noise.  Denies hallucinations here.  Patient has been calm and cooperative since last night.  Will seek geropsychiatry unless she stabilizes.  Past Psychiatric History: dementia  Risk to Self: None Risk to Others: None Prior Inpatient Therapy: Prior Inpatient Therapy: No Prior Therapy Dates: NA Prior Therapy Facilty/Provider(s): NA Reason for Treatment: NA Prior Outpatient Therapy: Prior Outpatient Therapy: No Prior Therapy Dates: NA Prior Therapy Facilty/Provider(s): NA Reason for Treatment: NA Does patient have an ACCT team?: No Does patient have Intensive In-House Services?  : No Does patient have [facility] services? : No Does patient have P4CC services?: No  Past Medical History:  Past Medical History:  Diagnosis Date  . Hypertension   . Neuropathy     Past Surgical History:  Procedure Laterality Date  . ABDOMINAL HYSTERECTOMY    . APPENDECTOMY     Family History: History reviewed. No pertinent family history. Family Psychiatric  History: none Social  History:  Social History   Substance and Sexual Activity  Alcohol Use No  . Frequency: Never     Social History   Substance and Sexual Activity  Drug Use No    Social History   Socioeconomic History  . Marital status: Widowed    Spouse name: None  . Number of children: 2  . Years of education: 56  . Highest education level: None  Social Needs  . Financial resource strain: None  . Food insecurity - worry: None  . Food insecurity - inability: None  . Transportation needs - medical: None  . Transportation needs - non-medical: None  Occupational History  . Occupation: retired Network engineer  Tobacco Use  . Smoking status: Never Smoker  . Smokeless tobacco: Never Used  Substance and Sexual Activity  . Alcohol use: No    Frequency: Never  . Drug use: No  . Sexual activity: None  Other Topics Concern  . None  Social History Narrative   Lives at [facility].  Has 2 children.  Retired Mudlogger.  Education: high school.     Additional Social History:    Allergies:  No Known Allergies  Labs:  Results for orders placed or performed during the hospital encounter of 04/21/17 (from the past 48 hour(s))  I-Stat Troponin, ED (not at [facility])     Status: None   Collection Time: 04/21/17  1:02 PM  Result Value Ref Range   Troponin i, poc 0.01 0.00 - 0.08 ng/mL   Comment 3            Comment: Due to the release kinetics  of cTnI, a negative result within the first hours of the onset of symptoms does not rule out myocardial infarction with certainty. If myocardial infarction is still suspected, repeat the test at appropriate intervals.   CBC with Differential     Status: Abnormal   Collection Time: 04/21/17  1:42 PM  Result Value Ref Range   WBC 11.7 (H) 4.0 - 10.5 K/uL   RBC 4.28 3.87 - 5.11 MIL/uL   Hemoglobin 13.4 12.0 - 15.0 g/dL   HCT 42.1 36.0 - 46.0 %   MCV 98.4 78.0 - 100.0 fL   MCH 31.3 26.0 - 34.0 pg   MCHC 31.8 30.0 - 36.0 g/dL   RDW 13.5 11.5  - 15.5 %   Platelets 195 150 - 400 K/uL   Neutrophils Relative % 74 %   Neutro Abs 8.7 (H) 1.7 - 7.7 K/uL   Lymphocytes Relative 14 %   Lymphs Abs 1.6 0.7 - 4.0 K/uL   Monocytes Relative 11 %   Monocytes Absolute 1.2 (H) 0.1 - 1.0 K/uL   Eosinophils Relative 1 %   Eosinophils Absolute 0.1 0.0 - 0.7 K/uL   Basophils Relative 0 %   Basophils Absolute 0.0 0.0 - 0.1 K/uL  Comprehensive metabolic panel     Status: Abnormal   Collection Time: 04/21/17  1:42 PM  Result Value Ref Range   Sodium 140 135 - 145 mmol/L   Potassium 4.2 3.5 - 5.1 mmol/L   Chloride 108 101 - 111 mmol/L   CO2 24 22 - 32 mmol/L   Glucose, Bld 103 (H) 65 - 99 mg/dL   BUN 40 (H) 6 - 20 mg/dL   Creatinine, Ser 1.44 (H) 0.44 - 1.00 mg/dL   Calcium 9.9 8.9 - 10.3 mg/dL   Total Protein 7.3 6.5 - 8.1 g/dL   Albumin 4.2 3.5 - 5.0 g/dL   AST 29 15 - 41 U/L   ALT 21 14 - 54 U/L   Alkaline Phosphatase 96 38 - 126 U/L   Total Bilirubin 0.7 0.3 - 1.2 mg/dL   GFR calc non Af Amer 31 (L) >60 mL/min   GFR calc Af Amer 36 (L) >60 mL/min    Comment: (NOTE) The eGFR has been calculated using the CKD EPI equation. This calculation has not been validated in all clinical situations. eGFR's persistently <60 mL/min signify possible Chronic Kidney Disease.    Anion gap 8 5 - 15  Urinalysis, Routine w reflex microscopic     Status: Abnormal   Collection Time: 04/21/17  1:42 PM  Result Value Ref Range   Color, Urine YELLOW YELLOW   APPearance CLEAR CLEAR   Specific Gravity, Urine 1.014 1.005 - 1.030   pH 5.0 5.0 - 8.0   Glucose, UA NEGATIVE NEGATIVE mg/dL   Hgb urine dipstick NEGATIVE NEGATIVE   Bilirubin Urine NEGATIVE NEGATIVE   Ketones, ur NEGATIVE NEGATIVE mg/dL   Protein, ur NEGATIVE NEGATIVE mg/dL   Nitrite NEGATIVE NEGATIVE   Leukocytes, UA TRACE (A) NEGATIVE   RBC / HPF 0-5 0 - 5 RBC/hpf   WBC, UA 0-5 0 - 5 WBC/hpf   Bacteria, UA RARE (A) NONE SEEN   Squamous Epithelial / LPF 0-5 (A) NONE SEEN   Mucus PRESENT     Hyaline Casts, UA PRESENT   I-Stat CG4 Lactic Acid, ED     Status: None   Collection Time: 04/21/17  1:55 PM  Result Value Ref Range   Lactic Acid, Venous 1.70 0.5 - 1.9 mmol/L  Current Facility-Administered Medications  Medication Dose Route Frequency Provider Last Rate Last Dose  . donepezil (ARICEPT) tablet 10 mg  10 mg Oral QHS Gareth Morgan, MD   10 mg at 04/21/17 2104  . lisinopril (PRINIVIL,ZESTRIL) tablet 20 mg  20 mg Oral Daily Gareth Morgan, MD   20 mg at 04/22/17 1056  . loperamide (IMODIUM) capsule 2 mg  2 mg Oral QID PRN Gareth Morgan, MD      . Memantine HCl ER CP24 21 mg  21 mg Oral Daily Gareth Morgan, MD   21 mg at 04/22/17 1056  . metoprolol tartrate (LOPRESSOR) tablet 100 mg  100 mg Oral BID Gareth Morgan, MD   100 mg at 04/22/17 1056  . risperiDONE (RISPERDAL) tablet 0.5 mg  0.5 mg Oral BID Gareth Morgan, MD   0.5 mg at 04/22/17 1056  . sertraline (ZOLOFT) tablet 100 mg  100 mg Oral QHS Gareth Morgan, MD   100 mg at 04/21/17 2104  . simvastatin (ZOCOR) tablet 10 mg  10 mg Oral QHS Gareth Morgan, MD   10 mg at 04/21/17 2216   Current Outpatient Medications  Medication Sig Dispense Refill  . cyanocobalamin 1000 MCG tablet Take 1,000 mcg by mouth daily.    Marland Kitchen donepezil (ARICEPT) 10 MG tablet Take 10 mg by mouth at bedtime.    Marland Kitchen lisinopril (PRINIVIL,ZESTRIL) 20 MG tablet Take 20 mg by mouth daily.    Marland Kitchen loperamide (IMODIUM A-D) 2 MG tablet Take 2 mg by mouth 4 (four) times daily as needed for diarrhea or loose stools.    . Memantine HCl ER 21 MG CP24 Take by mouth.    . metoprolol tartrate (LOPRESSOR) 100 MG tablet Take 100 mg by mouth 2 (two) times daily.    . Multiple Vitamin (MULTIVITAMIN) tablet Take 1 tablet by mouth daily.    . potassium chloride SA (K-DUR,KLOR-CON) 20 MEQ tablet Take 20 mEq by mouth daily.    . sertraline (ZOLOFT) 100 MG tablet Take 1 tablet (100 mg total) by mouth at bedtime. 30 tablet 6  . simvastatin (ZOCOR) 10 MG tablet  Take 10 mg by mouth daily.      Musculoskeletal: Strength & Muscle Tone: within normal limits Gait & Station: unsteady Patient leans: N/A  Psychiatric Specialty Exam: Physical Exam  Constitutional: She appears well-developed and well-nourished.  HENT:  Head: Normocephalic.  Neck: Normal range of motion.  Respiratory: Effort normal.  Musculoskeletal: Normal range of motion.  Neurological: She is alert.  Psychiatric: Her speech is normal and behavior is normal. Judgment and thought content normal. Her mood appears anxious. Cognition and memory are impaired. She exhibits a depressed mood.    Review of Systems  Psychiatric/Behavioral: Positive for depression. The patient is nervous/anxious.   All other systems reviewed and are negative.   Blood pressure (!) 193/53, pulse 87, temperature 97.6 F (36.4 C), temperature source Oral, resp. rate 16, SpO2 99 %.There is no height or weight on file to calculate BMI.  General Appearance: Casual  Eye Contact:  Fair  Speech:  Normal Rate  Volume:  Normal  Mood:  Depressed  Affect:  Congruent  Thought Process:  Coherent and Descriptions of Associations: Intact  Orientation:  Other:  person  Thought Content:  Rumination  Suicidal Thoughts:  Yes.  with intent/plan  Homicidal Thoughts:  No  Memory:  Immediate;   Poor Recent;   Poor Remote;   Poor  Judgement:  Impaired  Insight:  Lacking  Psychomotor Activity:  Normal  Concentration:  Concentration: Fair and Attention Span: Fair  Recall:  Poor  Fund of Knowledge:  Fair  Language:  Fair  Akathisia:  No  Handed:  Right  AIMS (if indicated):     Assets:  Housing Leisure Time Resilience Social Support  ADL's:  Impaired  Cognition:  Impaired,  Moderate  Sleep:        Treatment Plan Summary: Daily contact with patient to assess and evaluate symptoms and progress in treatment, Medication management and Plan dementia with behavioral disturbance:  -Crisis stabilization -Medication  management:  Continue medical medications along with Risperdal 0.5 mg BID for psychosis/mood and Zoloft 100 mg daily for depression -Individual counseling  Disposition: Recommend psychiatric Inpatient admission when medically cleared.  Waylan Boga, NP 04/22/2017 11:37 AM

## 2017-04-23 ENCOUNTER — Encounter: Payer: Self-pay | Admitting: *Deleted

## 2017-04-23 DIAGNOSIS — R45851 Suicidal ideations: Secondary | ICD-10-CM | POA: Diagnosis not present

## 2017-04-23 DIAGNOSIS — F419 Anxiety disorder, unspecified: Secondary | ICD-10-CM | POA: Diagnosis not present

## 2017-04-23 DIAGNOSIS — F0391 Unspecified dementia with behavioral disturbance: Secondary | ICD-10-CM | POA: Diagnosis not present

## 2017-04-23 DIAGNOSIS — G309 Alzheimer's disease, unspecified: Secondary | ICD-10-CM | POA: Diagnosis not present

## 2017-04-23 DIAGNOSIS — R450 Nervousness: Secondary | ICD-10-CM | POA: Diagnosis not present

## 2017-04-23 DIAGNOSIS — F0281 Dementia in other diseases classified elsewhere with behavioral disturbance: Secondary | ICD-10-CM | POA: Diagnosis not present

## 2017-04-23 NOTE — Consult Note (Signed)
[place] Psychiatry Consult   Reason for Consult:  Depression and suicidal ideations Referring Physician:  EDP Patient Identification: Veronica Rivas MRN:  [id number] Principal Diagnosis: dementia with behavioral disturbance Diagnosis:   Patient Active Problem List   Diagnosis Date Noted  . Dementia with behavioral disturbance [F03.91] 04/19/2017    Total Time spent with patient: 30 minutes  Subjective:   Veronica Rivas is a 83 y.o. female patient is stable for discharge.  HPI:  83 yo female who presented to the ED with suicidal ideations and threats to kill herself.  Her dog was recently taken as she went to a facility and she has been focused on this.  She has been calm and cooperative after her initial presentation with no suicidal/homicidal ideations, hallucinations, or substance abuse.  She is agreeable to return to her facility and feels safe.  Stable for dishcarge.  Past Psychiatric History: dementia  Risk to Self: None Risk to Others: None Prior Inpatient Therapy: Prior Inpatient Therapy: No Prior Therapy Dates: NA Prior Therapy Facilty/Provider(s): NA Reason for Treatment: NA Prior Outpatient Therapy: Prior Outpatient Therapy: No Prior Therapy Dates: NA Prior Therapy Facilty/Provider(s): NA Reason for Treatment: NA Does patient have an ACCT team?: No Does patient have Intensive In-House Services?  : No Does patient have [facility] services? : No Does patient have P4CC services?: No  Past Medical History:  Past Medical History:  Diagnosis Date  . Hypertension   . Neuropathy     Past Surgical History:  Procedure Laterality Date  . ABDOMINAL HYSTERECTOMY    . APPENDECTOMY     Family History: History reviewed. No pertinent family history. Family Psychiatric  History: none Social History:  Social History   Substance and Sexual Activity  Alcohol Use No  . Frequency: Never     Social History   Substance and Sexual Activity  Drug Use No    Social  History   Socioeconomic History  . Marital status: Widowed    Spouse name: None  . Number of children: 2  . Years of education: 22  . Highest education level: None  Social Needs  . Financial resource strain: None  . Food insecurity - worry: None  . Food insecurity - inability: None  . Transportation needs - medical: None  . Transportation needs - non-medical: None  Occupational History  . Occupation: retired Network engineer  Tobacco Use  . Smoking status: Never Smoker  . Smokeless tobacco: Never Used  Substance and Sexual Activity  . Alcohol use: No    Frequency: Never  . Drug use: No  . Sexual activity: None  Other Topics Concern  . None  Social History Narrative   Lives at [facility].  Has 2 children.  Retired Mudlogger.  Education: high school.     Additional Social History:    Allergies:  No Known Allergies  Labs:  Results for orders placed or performed during the hospital encounter of 04/21/17 (from the past 48 hour(s))  I-Stat Troponin, ED (not at [facility])     Status: None   Collection Time: 04/21/17  1:02 PM  Result Value Ref Range   Troponin i, poc 0.01 0.00 - 0.08 ng/mL   Comment 3            Comment: Due to the release kinetics of cTnI, a negative result within the first hours of the onset of symptoms does not rule out myocardial infarction with certainty. If myocardial infarction is still suspected, repeat the test at  appropriate intervals.   CBC with Differential     Status: Abnormal   Collection Time: 04/21/17  1:42 PM  Result Value Ref Range   WBC 11.7 (H) 4.0 - 10.5 K/uL   RBC 4.28 3.87 - 5.11 MIL/uL   Hemoglobin 13.4 12.0 - 15.0 g/dL   HCT 42.1 36.0 - 46.0 %   MCV 98.4 78.0 - 100.0 fL   MCH 31.3 26.0 - 34.0 pg   MCHC 31.8 30.0 - 36.0 g/dL   RDW 13.5 11.5 - 15.5 %   Platelets 195 150 - 400 K/uL   Neutrophils Relative % 74 %   Neutro Abs 8.7 (H) 1.7 - 7.7 K/uL   Lymphocytes Relative 14 %   Lymphs Abs 1.6 0.7 - 4.0 K/uL    Monocytes Relative 11 %   Monocytes Absolute 1.2 (H) 0.1 - 1.0 K/uL   Eosinophils Relative 1 %   Eosinophils Absolute 0.1 0.0 - 0.7 K/uL   Basophils Relative 0 %   Basophils Absolute 0.0 0.0 - 0.1 K/uL  Comprehensive metabolic panel     Status: Abnormal   Collection Time: 04/21/17  1:42 PM  Result Value Ref Range   Sodium 140 135 - 145 mmol/L   Potassium 4.2 3.5 - 5.1 mmol/L   Chloride 108 101 - 111 mmol/L   CO2 24 22 - 32 mmol/L   Glucose, Bld 103 (H) 65 - 99 mg/dL   BUN 40 (H) 6 - 20 mg/dL   Creatinine, Ser 1.44 (H) 0.44 - 1.00 mg/dL   Calcium 9.9 8.9 - 10.3 mg/dL   Total Protein 7.3 6.5 - 8.1 g/dL   Albumin 4.2 3.5 - 5.0 g/dL   AST 29 15 - 41 U/L   ALT 21 14 - 54 U/L   Alkaline Phosphatase 96 38 - 126 U/L   Total Bilirubin 0.7 0.3 - 1.2 mg/dL   GFR calc non Af Amer 31 (L) >60 mL/min   GFR calc Af Amer 36 (L) >60 mL/min    Comment: (NOTE) The eGFR has been calculated using the CKD EPI equation. This calculation has not been validated in all clinical situations. eGFR's persistently <60 mL/min signify possible Chronic Kidney Disease.    Anion gap 8 5 - 15  Urinalysis, Routine w reflex microscopic     Status: Abnormal   Collection Time: 04/21/17  1:42 PM  Result Value Ref Range   Color, Urine YELLOW YELLOW   APPearance CLEAR CLEAR   Specific Gravity, Urine 1.014 1.005 - 1.030   pH 5.0 5.0 - 8.0   Glucose, UA NEGATIVE NEGATIVE mg/dL   Hgb urine dipstick NEGATIVE NEGATIVE   Bilirubin Urine NEGATIVE NEGATIVE   Ketones, ur NEGATIVE NEGATIVE mg/dL   Protein, ur NEGATIVE NEGATIVE mg/dL   Nitrite NEGATIVE NEGATIVE   Leukocytes, UA TRACE (A) NEGATIVE   RBC / HPF 0-5 0 - 5 RBC/hpf   WBC, UA 0-5 0 - 5 WBC/hpf   Bacteria, UA RARE (A) NONE SEEN   Squamous Epithelial / LPF 0-5 (A) NONE SEEN   Mucus PRESENT    Hyaline Casts, UA PRESENT   Urine culture     Status: Abnormal   Collection Time: 04/21/17  1:42 PM  Result Value Ref Range   Specimen Description URINE, CLEAN CATCH     Special Requests NONE    Culture MULTIPLE SPECIES PRESENT, SUGGEST RECOLLECTION (A)    Report Status 04/22/2017 FINAL   I-Stat CG4 Lactic Acid, ED     Status: None  Collection Time: 04/21/17  1:55 PM  Result Value Ref Range   Lactic Acid, Venous 1.70 0.5 - 1.9 mmol/L    Current Facility-Administered Medications  Medication Dose Route Frequency Provider Last Rate Last Dose  . donepezil (ARICEPT) tablet 10 mg  10 mg Oral QHS Gareth Morgan, MD   10 mg at 04/22/17 2115  . lisinopril (PRINIVIL,ZESTRIL) tablet 20 mg  20 mg Oral Daily Gareth Morgan, MD   20 mg at 04/23/17 0926  . loperamide (IMODIUM) capsule 2 mg  2 mg Oral QID PRN Gareth Morgan, MD      . Memantine HCl ER CP24 21 mg  21 mg Oral Daily Gareth Morgan, MD   21 mg at 04/23/17 0933  . metoprolol tartrate (LOPRESSOR) tablet 100 mg  100 mg Oral BID Gareth Morgan, MD   Stopped at 04/23/17 [phone]  . risperiDONE (RISPERDAL) tablet 0.5 mg  0.5 mg Oral BID Gareth Morgan, MD   0.5 mg at 04/23/17 0926  . sertraline (ZOLOFT) tablet 100 mg  100 mg Oral QHS Gareth Morgan, MD   100 mg at 04/22/17 2116  . simvastatin (ZOCOR) tablet 10 mg  10 mg Oral QHS Gareth Morgan, MD   10 mg at 04/22/17 2115   Current Outpatient Medications  Medication Sig Dispense Refill  . cyanocobalamin 1000 MCG tablet Take 1,000 mcg by mouth daily.    Marland Kitchen donepezil (ARICEPT) 10 MG tablet Take 10 mg by mouth at bedtime.    Marland Kitchen lisinopril (PRINIVIL,ZESTRIL) 20 MG tablet Take 20 mg by mouth daily.    Marland Kitchen loperamide (IMODIUM A-D) 2 MG tablet Take 2 mg by mouth 4 (four) times daily as needed for diarrhea or loose stools.    . Memantine HCl ER 21 MG CP24 Take by mouth.    . metoprolol tartrate (LOPRESSOR) 100 MG tablet Take 100 mg by mouth 2 (two) times daily.    . Multiple Vitamin (MULTIVITAMIN) tablet Take 1 tablet by mouth daily.    . potassium chloride SA (K-DUR,KLOR-CON) 20 MEQ tablet Take 20 mEq by mouth daily.    . sertraline (ZOLOFT) 100 MG tablet Take  1 tablet (100 mg total) by mouth at bedtime. 30 tablet 6  . simvastatin (ZOCOR) 10 MG tablet Take 10 mg by mouth daily.      Musculoskeletal: Strength & Muscle Tone: within normal limits Gait & Station: unsteady Patient leans: N/A  Psychiatric Specialty Exam: Physical Exam  Constitutional: She appears well-developed and well-nourished.  HENT:  Head: Normocephalic.  Neck: Normal range of motion.  Respiratory: Effort normal.  Musculoskeletal: Normal range of motion.  Neurological: She is alert.  Psychiatric: Her speech is normal and behavior is normal. Judgment and thought content normal. Cognition and memory are impaired. She exhibits a depressed mood.    Review of Systems  Psychiatric/Behavioral: Positive for depression.  All other systems reviewed and are negative.   Blood pressure (!) 195/42, pulse (!) 58, temperature 98.2 F (36.8 C), temperature source Axillary, resp. rate 16, SpO2 97 %.There is no height or weight on file to calculate BMI.  General Appearance: Casual  Eye Contact:  Fair  Speech:  Normal Rate  Volume:  Normal  Mood:  Depressed, mild  Affect:  Congruent  Thought Process:  Coherent and Descriptions of Associations: Intact  Orientation:  Other:  person  Thought Content:  Rumination  Suicidal Thoughts:  No  Homicidal Thoughts:  No  Memory:  Immediate;   Poor Recent;   Poor Remote;   Poor  Judgement:  Impaired  Insight:  Lacking  Psychomotor Activity:  Normal  Concentration:  Concentration: Fair and Attention Span: Fair  Recall:  Poor  Fund of Knowledge:  Fair  Language:  Fair  Akathisia:  No  Handed:  Right  AIMS (if indicated):     Assets:  Housing Leisure Time Resilience Social Support  ADL's:  Impaired  Cognition:  Impaired,  Moderate  Sleep:       Treatment Plan Summary: Daily contact with patient to assess and evaluate symptoms and progress in treatment, Medication management and Plan dementia with behavioral disturbance:  -Crisis  stabilization -Medication management:  Continue medical medications along with Risperdal 0.5 mg BID for psychosis/mood and Zoloft 100 mg daily for depression -Individual counseling  Disposition: Discharge back to her facility  Waylan Boga, NP 04/23/2017 10:33 AM

## 2017-04-23 NOTE — BH Assessment (Signed)
[facility] Assessment Progress Note  Per Nelly RoutArchana Kumar, MD, this pt does not require psychiatric hospitalization at this time.  Pt presents under IVC initiated by EDP Alvira MondayErin Schlossman, MD, which Dr Lucianne MussKumar has rescinded.  [facility]  Stacy GardnerErin Davenport, LCSW agrees to facilitate return to residential facility.  Pt's nurse, Aram BeechamCynthia, has been notified.  Doylene Canninghomas Jessabelle Markiewicz, MA Triage Specialist [phone]

## 2017-04-23 NOTE — Progress Notes (Addendum)
2:06 PM: CS[facility] and [facility]. CSW received phone call from pt's daughter, Bonita QuinLinda, stating that she is on her way to pick the pt up. Daughter will transport [facility]. Pt's daughter expressed concerns about pt's psychiatric well being. CSW explained that pt has been cleared medically and psychiatrically. CSW provided community resources.   11:06 AM: CSW spoke with disposition team about daughter's concerns. Disposition team stated that pt has been calm and cooperative and is clear to go home. CSW called and left voice message with daughter asking her to call back.   10:45 am: [facility]. CSW spoke with RN Evette at [phone]. Evette agreed to patient returning to [facility]. Evette requested a copy of the AVS. CSW called daughter, Bonita QuinLinda at [phone], to arrange patient to be picked up and returned to [facility]. Bonita QuinLinda expressed concerns about pt returning to [facility] due to "nasty" messages received last week from patient. Linda asked to speak with disposition team about an IVC and more intensive care. Daughter stated she was currently bowling and asked to call CSW back around [phone].   Plan: CSW will follow up with disposition team.   Montine CircleKelsy Londen Lorge, Silverio LayLCSWA [facility]  [phone]

## 2017-04-23 NOTE — Progress Notes (Deleted)
11:06 AM: CSW spoke with disposition team about daughter's concerns. Disposition team stated that pt has been calm and cooperative and is clear to go home. CSW called and left voice message with daughter asking her to call back.   10:45 am: [facility]. CSW spoke with RN Evette at [phone]. Evette agreed to patient returning [facility]. Evette requested a copy of the AVS. CSW called daughter, Bonita QuinLinda at [phone], to arrange patient to be picked up and returned [facility]. Bonita QuinLinda expressed concerns about pt returning [facility] due to "nasty" messages received last week from patient. Linda asked to speak with disposition team about an IVC and more intensive care. Daughter stated she was currently bowling and asked to call CSW back around [phone].   Plan: CSW will follow up with disposition team.   Montine CircleKelsy McAnelly, Silverio LayLCSWA [facility]  [phone]

## 2017-04-23 NOTE — [facility] Suicide Risk Assessment (Signed)
Suicide Risk Assessment  Discharge Assessment   [facility]   Principal Problem: <principal problem not specified> Discharge Diagnoses:  Patient Active Problem List   Diagnosis Date Noted  . Dementia with behavioral disturbance [F03.91] 04/19/2017    Total Time spent with patient: 45 minutes  Musculoskeletal: Strength & Muscle Tone: within normal limits Gait & Station: unsteady Patient leans: N/A  Psychiatric Specialty Exam: Physical Exam  Constitutional: She appears well-developed and well-nourished.  HENT:  Head: Normocephalic.  Neck: Normal range of motion.  Respiratory: Effort normal.  Musculoskeletal: Normal range of motion.  Neurological: She is alert.  Psychiatric: Her speech is normal and behavior is normal. Judgment and thought content normal. Cognition and memory are impaired. She exhibits a depressed mood.    Review of Systems  Psychiatric/Behavioral: Positive for depression.  All other systems reviewed and are negative.   Blood pressure (!) 195/42, pulse (!) 58, temperature 98.2 F (36.8 C), temperature source Axillary, resp. rate 16, SpO2 97 %.There is no height or weight on file to calculate BMI.  General Appearance: Casual  Eye Contact:  Fair  Speech:  Normal Rate  Volume:  Normal  Mood:  Depressed, mild  Affect:  Congruent  Thought Process:  Coherent and Descriptions of Associations: Intact  Orientation:  Other:  person  Thought Content:  Rumination  Suicidal Thoughts:  No  Homicidal Thoughts:  No  Memory:  Immediate;   Poor Recent;   Poor Remote;   Poor  Judgement:  Impaired  Insight:  Lacking  Psychomotor Activity:  Normal  Concentration:  Concentration: Fair and Attention Span: Fair  Recall:  Poor  Fund of Knowledge:  Fair  Language:  Fair  Akathisia:  No  Handed:  Right  AIMS (if indicated):     Assets:  Housing Leisure Time Resilience Social Support  ADL's:  Impaired  Cognition:  Impaired,  Moderate   Sleep:       Mental Status Per Nursing Assessment::   On Admission:   depression with suicidal ideations  Demographic Factors:  Age 83 or older and Caucasian  Loss Factors: NA  Historical Factors: NA  Risk Reduction Factors:   Sense of responsibility to family and Positive therapeutic relationship  Continued Clinical Symptoms:  Depression, mild  Cognitive Features That Contribute To Risk:  None    Suicide Risk:  Minimal: No identifiable suicidal ideation.  Patients presenting with no risk factors but with morbid ruminations; may be classified as minimal risk based on the severity of the depressive symptoms    Plan Of Care/Follow-up recommendations:  Activity:  asd tolerated Diet:  heart healthy diet  LORD, JAMISON, NP 04/23/2017, 12:21 PM

## 2017-06-21 ENCOUNTER — Emergency Department (HOSPITAL_BASED_OUTPATIENT_CLINIC_OR_DEPARTMENT_OTHER)
Admission: EM | Admit: 2017-06-21 | Discharge: 2017-06-21 | Disposition: A | Discharge status: Home / Self Care | Payer: Medicare HMO | Attending: Physician Assistant | Admitting: Physician Assistant

## 2017-06-21 ENCOUNTER — Encounter (HOSPITAL_BASED_OUTPATIENT_CLINIC_OR_DEPARTMENT_OTHER): Payer: Self-pay | Admitting: *Deleted

## 2017-06-21 ENCOUNTER — Emergency Department (HOSPITAL_BASED_OUTPATIENT_CLINIC_OR_DEPARTMENT_OTHER): Payer: Medicare HMO

## 2017-06-21 ENCOUNTER — Other Ambulatory Visit: Payer: Self-pay

## 2017-06-21 DIAGNOSIS — I10 Essential (primary) hypertension: Secondary | ICD-10-CM | POA: Diagnosis not present

## 2017-06-21 DIAGNOSIS — F0391 Unspecified dementia with behavioral disturbance: Secondary | ICD-10-CM | POA: Diagnosis present

## 2017-06-21 DIAGNOSIS — R296 Repeated falls: Secondary | ICD-10-CM

## 2017-06-21 DIAGNOSIS — Z79899 Other long term (current) drug therapy: Secondary | ICD-10-CM | POA: Insufficient documentation

## 2017-06-21 LAB — COMPREHENSIVE METABOLIC PANEL
ALK PHOS: 77 U/L (ref 38–126)
ALT: 19 U/L (ref 14–54)
AST: 31 U/L (ref 15–41)
Albumin: 3.8 g/dL (ref 3.5–5.0)
Anion gap: 11 (ref 5–15)
BUN: 31 mg/dL — AB (ref 6–20)
CALCIUM: 9.9 mg/dL (ref 8.9–10.3)
CO2: 27 mmol/L (ref 22–32)
CREATININE: 1.34 mg/dL — AB (ref 0.44–1.00)
Chloride: 105 mmol/L (ref 101–111)
GFR, EST AFRICAN AMERICAN: 40 mL/min — AB (ref >60)
GFR, EST NON AFRICAN AMERICAN: 34 mL/min — AB (ref >60)
Glucose, Bld: 109 mg/dL — ABNORMAL HIGH (ref 65–99)
Potassium: 4.1 mmol/L (ref 3.5–5.1)
Sodium: 143 mmol/L (ref 135–145)
Total Bilirubin: 0.4 mg/dL (ref 0.3–1.2)
Total Protein: 6.9 g/dL (ref 6.5–8.1)

## 2017-06-21 LAB — URINALYSIS, ROUTINE W REFLEX MICROSCOPIC
Bilirubin Urine: NEGATIVE
GLUCOSE, UA: NEGATIVE mg/dL
HGB URINE DIPSTICK: NEGATIVE
Ketones, ur: NEGATIVE mg/dL
LEUKOCYTES UA: NEGATIVE
Nitrite: NEGATIVE
Protein, ur: NEGATIVE mg/dL
SPECIFIC GRAVITY, URINE: 1.025 (ref 1.005–1.030)
pH: 5.5 (ref 5.0–8.0)

## 2017-06-21 LAB — CBC WITH DIFFERENTIAL/PLATELET
BASOS PCT: 1 %
Basophils Absolute: 0.1 10*3/uL (ref 0.0–0.1)
EOS ABS: 0.2 10*3/uL (ref 0.0–0.7)
EOS PCT: 2 %
HCT: 38 % (ref 36.0–46.0)
Hemoglobin: 12 g/dL (ref 12.0–15.0)
LYMPHS ABS: 1.6 10*3/uL (ref 0.7–4.0)
Lymphocytes Relative: 16 %
MCH: 30.6 pg (ref 26.0–34.0)
MCHC: 31.6 g/dL (ref 30.0–36.0)
MCV: 96.9 fL (ref 78.0–100.0)
MONOS PCT: 13 %
Monocytes Absolute: 1.3 10*3/uL — ABNORMAL HIGH (ref 0.1–1.0)
Neutro Abs: 6.9 10*3/uL (ref 1.7–7.7)
Neutrophils Relative %: 68 %
PLATELETS: 209 10*3/uL (ref 150–400)
RBC: 3.92 MIL/uL (ref 3.87–5.11)
RDW: 13.6 % (ref 11.5–15.5)
WBC: 10 10*3/uL (ref 4.0–10.5)

## 2017-06-21 MED ORDER — LORAZEPAM 1 MG PO TABS
0.5000 mg | ORAL_TABLET | Freq: Once | ORAL | Status: AC
  Administered 2017-06-21: 0.5 mg via ORAL

## 2017-06-21 MED ORDER — LORAZEPAM 1 MG PO TABS
ORAL_TABLET | ORAL | Status: AC
  Filled 2017-06-21: qty 1

## 2017-06-21 NOTE — ED Notes (Signed)
[facility]

## 2017-06-21 NOTE — ED Notes (Signed)
NAD at this time. Pt is stable and going home.  

## 2017-06-21 NOTE — ED Notes (Signed)
Updated pt's daughter Godfrey PickLinda Fries to results from ED visit.  She would like a phone call when pt is transferred back [phone]

## 2017-06-21 NOTE — Discharge Instructions (Signed)
We did a lab work, a CAT scan, and x-ray as well as a urine on Miss Veronica Rivas.  All this was reassuring.  Please have her follow-up with primary care physician for further workup.

## 2017-06-21 NOTE — ED Notes (Signed)
[facility] arrived to [facility]. Pt daughter called and given update about patient transfer.

## 2017-06-21 NOTE — ED Triage Notes (Signed)
[facility] and they reported that she fell 4 times in the past 24 hrs.  They said she never hit her head and is not on blood thinners, however they are worried about possible UTI.  Her stress level has been up due to the loss of her dog. She has been using  A Walker but now a wheelchair, with her progression in dementia she hasn't been using either.  Vs are as follows Per [facility]. BP: 168/70 HR: 76 Resp: 20 CBG: 164.

## 2017-06-21 NOTE — ED Provider Notes (Signed)
[facility] EMERGENCY DEPARTMENT Provider Note   CSN: [id number] Arrival date & time: 06/21/17  1542     History   Chief Complaint Chief Complaint  Patient presents with  . Fall    HPI Veronica Rivas is a 84 y.o. female.  HPI   84 year old female presenting with multiple falls and concerned that her memory unit that she is been acting more stress than usual.  Patient is demented and oriented only to herself.  Patient has no signs of trauma visibly.  She has normal vital signs.  Level 5 caveat dementia.  Past Medical History:  Diagnosis Date  . Hypertension   . Neuropathy     Patient Active Problem List   Diagnosis Date Noted  . Dementia with behavioral disturbance 04/19/2017    Past Surgical History:  Procedure Laterality Date  . ABDOMINAL HYSTERECTOMY    . APPENDECTOMY      OB History    No data available       Home Medications    Prior to Admission medications   Medication Sig Start Date End Date Taking? Authorizing Provider  cyanocobalamin 1000 MCG tablet Take 1,000 mcg by mouth daily.    [provider]  donepezil (ARICEPT) 10 MG tablet Take 10 mg by mouth at bedtime.    [provider]  lisinopril (PRINIVIL,ZESTRIL) 20 MG tablet Take 20 mg by mouth daily.    [provider]  loperamide (IMODIUM A-D) 2 MG tablet Take 2 mg by mouth 4 (four) times daily as needed for diarrhea or loose stools.    [provider]  Memantine HCl ER 21 MG CP24 Take by mouth.    [provider]  metoprolol tartrate (LOPRESSOR) 100 MG tablet Take 100 mg by mouth 2 (two) times daily.    [provider]  Multiple Vitamin (MULTIVITAMIN) tablet Take 1 tablet by mouth daily.    [provider]  potassium chloride SA (K-DUR,KLOR-CON) 20 MEQ tablet Take 20 mEq by mouth daily.    [provider]  sertraline (ZOLOFT) 100 MG tablet Take 1 tablet (100 mg total) by mouth at bedtime. 04/17/17   Van Clines, MD  simvastatin (ZOCOR) 10 MG tablet Take 10 mg by mouth daily.    [provider]    Family History History reviewed. No pertinent family history.  Social History Social History   Tobacco Use  . Smoking status: Never Smoker  . Smokeless tobacco: Never Used  Substance Use Topics  . Alcohol use: No    Frequency: Never  . Drug use: No     Allergies   Patient has no known allergies.   Review of Systems Review of Systems  Unable to perform ROS: Dementia  Constitutional: Positive for activity change.     Physical Exam Updated Vital Signs BP (!) 172/61 (BP Location: Left Arm)   Pulse 68   Temp 98.2 F (36.8 C) (Oral)   Resp 16   SpO2 100%   Physical Exam  Constitutional: She appears well-developed and well-nourished.  No signs of trauma.  HENT:  Head: Normocephalic and atraumatic.  Poor dentition.  Eyes: Right eye exhibits no discharge. Left eye exhibits no discharge.  Cardiovascular: Normal rate and regular rhythm.  Pulmonary/Chest: Effort normal and breath sounds normal. No respiratory distress.  Musculoskeletal:  Currently deformed left foot.  Mild swelling bilaterally.  Neurological:  Oriented to self  Skin: Skin is warm and dry. She is not diaphoretic.  Psychiatric: She has a  normal mood and affect.  Nursing note and vitals reviewed.    ED Treatments / Results  Labs (all labs ordered are listed, but only abnormal results are displayed) Labs Reviewed  URINALYSIS, ROUTINE W REFLEX MICROSCOPIC  COMPREHENSIVE METABOLIC PANEL  CBC WITH DIFFERENTIAL/PLATELET    EKG  EKG Interpretation None       Radiology No results found.  Procedures Procedures (including critical care time)  Medications Ordered in ED Medications - No data to display   Initial Impression / Assessment and Plan / ED Course  I have reviewed the triage vital signs and the nursing notes.  Pertinent labs & imaging results that were available during my care of the  patient were reviewed by me and considered in my medical decision making (see chart for details).     84 year old female presenting with multiple falls and concerned that her memory unit that she is been acting more stress than usual.  Patient is demented and oriented only to herself.  Patient has no signs of trauma visibly.  She has normal vital signs.  Level 5 caveat dementia.   5:11 PM [facility] here for evaluation because she has had multiple falls.  Will do CT head given her high risk age.  Otherwise will do baseline labs, urine and chest x-ray to make sure there is no occult infection.  Otherwise is may be worsening of patient's dementia given that her vital signs are all normal and she has no external issues noted on exam.   Final Clinical Impressions(s) / ED Diagnoses   Final diagnoses:  None    ED Discharge Orders    None       Abelino DerrickMackuen, Courteney Lyn, MD 06/21/17 2309

## 2017-07-08 ENCOUNTER — Emergency Department (HOSPITAL_BASED_OUTPATIENT_CLINIC_OR_DEPARTMENT_OTHER): Payer: Medicare HMO

## 2017-07-08 ENCOUNTER — Encounter (HOSPITAL_BASED_OUTPATIENT_CLINIC_OR_DEPARTMENT_OTHER): Payer: Self-pay

## 2017-07-08 ENCOUNTER — Other Ambulatory Visit: Payer: Self-pay

## 2017-07-08 ENCOUNTER — Inpatient Hospital Stay (HOSPITAL_BASED_OUTPATIENT_CLINIC_OR_DEPARTMENT_OTHER)
Admission: EM | Admit: 2017-07-08 | Discharge: 2017-07-20 | DRG: 193 | Disposition: E | Payer: Medicare HMO | Attending: Family Medicine | Admitting: Family Medicine

## 2017-07-08 DIAGNOSIS — E1140 Type 2 diabetes mellitus with diabetic neuropathy, unspecified: Secondary | ICD-10-CM | POA: Diagnosis present

## 2017-07-08 DIAGNOSIS — Z66 Do not resuscitate: Secondary | ICD-10-CM | POA: Diagnosis present

## 2017-07-08 DIAGNOSIS — N183 Chronic kidney disease, stage 3 (moderate): Secondary | ICD-10-CM | POA: Diagnosis present

## 2017-07-08 DIAGNOSIS — R402132 Coma scale, eyes open, to sound, at arrival to emergency department: Secondary | ICD-10-CM | POA: Diagnosis present

## 2017-07-08 DIAGNOSIS — R5381 Other malaise: Secondary | ICD-10-CM | POA: Diagnosis present

## 2017-07-08 DIAGNOSIS — F445 Conversion disorder with seizures or convulsions: Secondary | ICD-10-CM | POA: Diagnosis present

## 2017-07-08 DIAGNOSIS — E119 Type 2 diabetes mellitus without complications: Secondary | ICD-10-CM

## 2017-07-08 DIAGNOSIS — Z79899 Other long term (current) drug therapy: Secondary | ICD-10-CM

## 2017-07-08 DIAGNOSIS — R4182 Altered mental status, unspecified: Secondary | ICD-10-CM | POA: Diagnosis present

## 2017-07-08 DIAGNOSIS — F0391 Unspecified dementia with behavioral disturbance: Secondary | ICD-10-CM | POA: Diagnosis present

## 2017-07-08 DIAGNOSIS — R0902 Hypoxemia: Secondary | ICD-10-CM | POA: Diagnosis present

## 2017-07-08 DIAGNOSIS — R402222 Coma scale, best verbal response, incomprehensible words, at arrival to emergency department: Secondary | ICD-10-CM | POA: Diagnosis present

## 2017-07-08 DIAGNOSIS — Z90710 Acquired absence of both cervix and uterus: Secondary | ICD-10-CM | POA: Diagnosis not present

## 2017-07-08 DIAGNOSIS — J101 Influenza due to other identified influenza virus with other respiratory manifestations: Principal | ICD-10-CM | POA: Diagnosis present

## 2017-07-08 DIAGNOSIS — E1122 Type 2 diabetes mellitus with diabetic chronic kidney disease: Secondary | ICD-10-CM | POA: Diagnosis present

## 2017-07-08 DIAGNOSIS — R509 Fever, unspecified: Secondary | ICD-10-CM

## 2017-07-08 DIAGNOSIS — A419 Sepsis, unspecified organism: Secondary | ICD-10-CM | POA: Diagnosis not present

## 2017-07-08 DIAGNOSIS — E785 Hyperlipidemia, unspecified: Secondary | ICD-10-CM

## 2017-07-08 DIAGNOSIS — F0151 Vascular dementia with behavioral disturbance: Secondary | ICD-10-CM | POA: Diagnosis present

## 2017-07-08 DIAGNOSIS — R402362 Coma scale, best motor response, obeys commands, at arrival to emergency department: Secondary | ICD-10-CM | POA: Diagnosis present

## 2017-07-08 DIAGNOSIS — G40909 Epilepsy, unspecified, not intractable, without status epilepticus: Secondary | ICD-10-CM | POA: Diagnosis present

## 2017-07-08 DIAGNOSIS — I129 Hypertensive chronic kidney disease with stage 1 through stage 4 chronic kidney disease, or unspecified chronic kidney disease: Secondary | ICD-10-CM | POA: Diagnosis present

## 2017-07-08 DIAGNOSIS — E7800 Pure hypercholesterolemia, unspecified: Secondary | ICD-10-CM | POA: Diagnosis present

## 2017-07-08 DIAGNOSIS — I4892 Unspecified atrial flutter: Secondary | ICD-10-CM | POA: Diagnosis present

## 2017-07-08 DIAGNOSIS — I361 Nonrheumatic tricuspid (valve) insufficiency: Secondary | ICD-10-CM | POA: Diagnosis not present

## 2017-07-08 DIAGNOSIS — F03918 Unspecified dementia, unspecified severity, with other behavioral disturbance: Secondary | ICD-10-CM | POA: Diagnosis present

## 2017-07-08 DIAGNOSIS — I10 Essential (primary) hypertension: Secondary | ICD-10-CM

## 2017-07-08 HISTORY — DX: Pure hypercholesterolemia, unspecified: E78.00

## 2017-07-08 HISTORY — DX: Vitamin D deficiency, unspecified: E55.9

## 2017-07-08 HISTORY — DX: Disorder of kidney and ureter, unspecified: N28.9

## 2017-07-08 HISTORY — DX: Type 2 diabetes mellitus without complications: E11.9

## 2017-07-08 HISTORY — DX: Essential (primary) hypertension: I10

## 2017-07-08 HISTORY — DX: Hyperlipidemia, unspecified: E78.5

## 2017-07-08 HISTORY — DX: Unspecified dementia, unspecified severity, without behavioral disturbance, psychotic disturbance, mood disturbance, and anxiety: F03.90

## 2017-07-08 HISTORY — DX: Conversion disorder with seizures or convulsions: F44.5

## 2017-07-08 LAB — CBC WITH DIFFERENTIAL/PLATELET
BASOS ABS: 0 10*3/uL (ref 0.0–0.1)
Band Neutrophils: 5 %
Basophils Relative: 0 %
Eosinophils Absolute: 0 10*3/uL (ref 0.0–0.7)
Eosinophils Relative: 0 %
HCT: 38.6 % (ref 36.0–46.0)
Hemoglobin: 12.1 g/dL (ref 12.0–15.0)
Lymphocytes Relative: 8 %
Lymphs Abs: 1 10*3/uL (ref 0.7–4.0)
MCH: 30.6 pg (ref 26.0–34.0)
MCHC: 31.3 g/dL (ref 30.0–36.0)
MCV: 97.7 fL (ref 78.0–100.0)
METAMYELOCYTES PCT: 1 %
MONOS PCT: 20 %
Monocytes Absolute: 2.4 10*3/uL — ABNORMAL HIGH (ref 0.1–1.0)
Myelocytes: 1 %
NEUTROS PCT: 65 %
Neutro Abs: 8.6 10*3/uL — ABNORMAL HIGH (ref 1.7–7.7)
PLATELETS: ADEQUATE 10*3/uL (ref 150–400)
RBC: 3.95 MIL/uL (ref 3.87–5.11)
RDW: 13.6 % (ref 11.5–15.5)
WBC: 12 10*3/uL — ABNORMAL HIGH (ref 4.0–10.5)

## 2017-07-08 LAB — URINALYSIS, ROUTINE W REFLEX MICROSCOPIC
Bilirubin Urine: NEGATIVE
GLUCOSE, UA: NEGATIVE mg/dL
KETONES UR: 15 mg/dL — AB
LEUKOCYTES UA: NEGATIVE
Nitrite: NEGATIVE
PROTEIN: 30 mg/dL — AB
Specific Gravity, Urine: >1.03 — ABNORMAL HIGH (ref 1.005–1.030)
pH: 5.5 (ref 5.0–8.0)

## 2017-07-08 LAB — URINALYSIS, MICROSCOPIC (REFLEX)

## 2017-07-08 LAB — COMPREHENSIVE METABOLIC PANEL
ALT: 14 U/L (ref 14–54)
ANION GAP: 12 (ref 5–15)
AST: 24 U/L (ref 15–41)
Albumin: 3.2 g/dL — ABNORMAL LOW (ref 3.5–5.0)
Alkaline Phosphatase: 66 U/L (ref 38–126)
BILIRUBIN TOTAL: 0.5 mg/dL (ref 0.3–1.2)
BUN: 31 mg/dL — AB (ref 6–20)
CO2: 28 mmol/L (ref 22–32)
Calcium: 9.8 mg/dL (ref 8.9–10.3)
Chloride: 106 mmol/L (ref 101–111)
Creatinine, Ser: 1.5 mg/dL — ABNORMAL HIGH (ref 0.44–1.00)
GFR, EST AFRICAN AMERICAN: 35 mL/min — AB (ref >60)
GFR, EST NON AFRICAN AMERICAN: 30 mL/min — AB (ref >60)
Glucose, Bld: 172 mg/dL — ABNORMAL HIGH (ref 65–99)
POTASSIUM: 3.7 mmol/L (ref 3.5–5.1)
Sodium: 146 mmol/L — ABNORMAL HIGH (ref 135–145)
TOTAL PROTEIN: 7.2 g/dL (ref 6.5–8.1)

## 2017-07-08 LAB — I-STAT CG4 LACTIC ACID, ED
LACTIC ACID, VENOUS: 0.81 mmol/L (ref 0.5–1.9)
Lactic Acid, Venous: 1.91 mmol/L — ABNORMAL HIGH (ref 0.5–1.9)

## 2017-07-08 LAB — MRSA PCR SCREENING: MRSA by PCR: NEGATIVE

## 2017-07-08 MED ORDER — DEXTROSE-NACL 5-0.45 % IV SOLN
INTRAVENOUS | Status: AC
  Administered 2017-07-08 – 2017-07-09 (×2): via INTRAVENOUS

## 2017-07-08 MED ORDER — INSULIN ASPART 100 UNIT/ML ~~LOC~~ SOLN
0.0000 [IU] | Freq: Three times a day (TID) | SUBCUTANEOUS | Status: DC
  Administered 2017-07-09 (×2): 3 [IU] via SUBCUTANEOUS
  Administered 2017-07-10: 1 [IU] via SUBCUTANEOUS

## 2017-07-08 MED ORDER — SODIUM CHLORIDE 0.9 % IV SOLN
1.0000 g | Freq: Every day | INTRAVENOUS | Status: DC
  Administered 2017-07-08: 1 g via INTRAVENOUS
  Filled 2017-07-08 (×2): qty 1

## 2017-07-08 MED ORDER — ACETAMINOPHEN 325 MG PO TABS: 650.0000 mg | ORAL_TABLET | Freq: Once | ORAL | Status: DC

## 2017-07-08 MED ORDER — METOPROLOL TARTRATE 50 MG PO TABS
100.0000 mg | ORAL_TABLET | Freq: Two times a day (BID) | ORAL | Status: DC
  Administered 2017-07-09 – 2017-07-10 (×4): 100 mg via ORAL
  Filled 2017-07-08 (×5): qty 2

## 2017-07-08 MED ORDER — ACETAMINOPHEN 650 MG RE SUPP
650.0000 mg | Freq: Once | RECTAL | Status: AC
  Administered 2017-07-08: 650 mg via RECTAL
  Filled 2017-07-08: qty 1

## 2017-07-08 MED ORDER — VANCOMYCIN HCL 10 G IV SOLR: 1500.0000 mg | INTRAVENOUS | Status: DC

## 2017-07-08 MED ORDER — SERTRALINE HCL 100 MG PO TABS
100.0000 mg | ORAL_TABLET | Freq: Every day | ORAL | Status: DC
  Administered 2017-07-09 – 2017-07-10 (×2): 100 mg via ORAL
  Filled 2017-07-08 (×3): qty 1

## 2017-07-08 MED ORDER — SIMVASTATIN 10 MG PO TABS
10.0000 mg | ORAL_TABLET | Freq: Every day | ORAL | Status: DC
  Administered 2017-07-09 – 2017-07-10 (×2): 10 mg via ORAL
  Filled 2017-07-08 (×2): qty 1

## 2017-07-08 MED ORDER — ONDANSETRON HCL 4 MG PO TABS: 4.0000 mg | ORAL_TABLET | Freq: Four times a day (QID) | ORAL | Status: DC | PRN

## 2017-07-08 MED ORDER — ONDANSETRON HCL 4 MG/2ML IJ SOLN: 4.0000 mg | Freq: Four times a day (QID) | INTRAMUSCULAR | Status: DC | PRN

## 2017-07-08 MED ORDER — SODIUM CHLORIDE 0.9 % IV SOLN: 1.0000 g | INTRAVENOUS | Status: DC

## 2017-07-08 MED ORDER — SODIUM CHLORIDE 0.9 % IV SOLN
1.0000 g | Freq: Once | INTRAVENOUS | Status: AC
  Administered 2017-07-08: 1 g via INTRAVENOUS
  Filled 2017-07-08: qty 10

## 2017-07-08 MED ORDER — DONEPEZIL HCL 10 MG PO TABS
10.0000 mg | ORAL_TABLET | Freq: Every day | ORAL | Status: DC
  Administered 2017-07-09 – 2017-07-10 (×2): 10 mg via ORAL
  Filled 2017-07-08 (×3): qty 1

## 2017-07-08 MED ORDER — SODIUM CHLORIDE 0.9 % IV BOLUS (SEPSIS)
1000.0000 mL | Freq: Once | INTRAVENOUS | Status: AC
  Administered 2017-07-08: 1000 mL via INTRAVENOUS

## 2017-07-08 MED ORDER — LISINOPRIL 20 MG PO TABS
20.0000 mg | ORAL_TABLET | Freq: Every day | ORAL | Status: DC
  Administered 2017-07-09 – 2017-07-10 (×2): 20 mg via ORAL
  Filled 2017-07-08 (×3): qty 1

## 2017-07-08 MED ORDER — VANCOMYCIN HCL IN DEXTROSE 1-5 GM/200ML-% IV SOLN
1000.0000 mg | Freq: Once | INTRAVENOUS | Status: AC
  Administered 2017-07-08: 1000 mg via INTRAVENOUS
  Filled 2017-07-08: qty 200

## 2017-07-08 MED ORDER — ENOXAPARIN SODIUM 30 MG/0.3ML ~~LOC~~ SOLN
30.0000 mg | Freq: Every day | SUBCUTANEOUS | Status: DC
  Administered 2017-07-08: 30 mg via SUBCUTANEOUS
  Filled 2017-07-08: qty 0.3

## 2017-07-08 MED ORDER — ACETAMINOPHEN 650 MG RE SUPP: 650.0000 mg | Freq: Four times a day (QID) | RECTAL | Status: DC | PRN

## 2017-07-08 MED ORDER — VANCOMYCIN HCL IN DEXTROSE 1-5 GM/200ML-% IV SOLN: 1000.0000 mg | INTRAVENOUS | Status: DC

## 2017-07-08 MED ORDER — ACETAMINOPHEN 325 MG PO TABS
650.0000 mg | ORAL_TABLET | Freq: Four times a day (QID) | ORAL | Status: DC | PRN
  Administered 2017-07-10: 650 mg via ORAL
  Filled 2017-07-08 (×2): qty 2

## 2017-07-08 MED ORDER — VITAMIN B-12 1000 MCG PO TABS
1000.0000 ug | ORAL_TABLET | Freq: Every day | ORAL | Status: DC
Start: 2017-07-09 — End: 2017-07-11
  Administered 2017-07-09 – 2017-07-10 (×2): 1000 ug via ORAL
  Filled 2017-07-08 (×2): qty 1

## 2017-07-08 MED ORDER — VANCOMYCIN HCL IN DEXTROSE 750-5 MG/150ML-% IV SOLN
750.0000 mg | INTRAVENOUS | Status: DC
  Filled 2017-07-08: qty 150

## 2017-07-08 MED ORDER — POLYETHYLENE GLYCOL 3350 17 G PO PACK: 17.0000 g | PACK | Freq: Every day | ORAL | Status: DC | PRN

## 2017-07-08 NOTE — Progress Notes (Signed)
Pharmacy Antibiotic Note  Veronica Rivas is a 84 y.o. female admitted on 09/04/17 with pneumonia.  Pharmacy has   Plan: vanc 1 g x 1 then 750 q24h  Height: 5\' 6"  (167.6 cm) Weight: 163 lb (73.9 kg) IBW/kg (Calculated) : 59.3  Temp (24hrs), Avg:100.2 F (37.9 C), Min:100.1 F (37.8 C), Max:100.2 F (37.9 C)  Recent Labs  Lab 2017/06/12 1555 2017/06/12 1603 2017/06/12 1931  WBC 12.0*  --   --   CREATININE 1.50*  --   --   LATICACIDVEN  --  1.91* 0.81    Estimated Creatinine Clearance: 26.6 mL/min (A) (by C-G formula based on SCr of 1.5 mg/dL (H)).    No Known Allergies  Veronica Rivas, PharmD, BCPS, BCCCP Clinical Pharmacist Clinical phone for 09/04/17 from [phone]: [phone] If after 2300, please call main pharmacy at: [phone] 09/04/17 8:29 PM

## 2017-07-08 NOTE — ED Notes (Signed)
Pt states she needs to urinate. Assisted with badpan and incontinent care done.

## 2017-07-08 NOTE — ED Notes (Signed)
IV infiltrated. Infusion stopped and IV d/c. Warm compress placed. EDP notified.

## 2017-07-08 NOTE — ED Triage Notes (Signed)
Pt arrived via [facility]. Per EMS pt has had urinary symptoms x1 week with decreased appetite and refused medication today. Per EMS pt is warm and was at 85% SpO2 on R/A. Pt is on 4L via N/C.

## 2017-07-08 NOTE — ED Notes (Signed)
Pt's IV in R upper arm infiltrated. D/C at this time. Warm compress applied. EDP notified.

## 2017-07-08 NOTE — ED Notes (Signed)
Pt incontinent of urine. Linens changed. Repositioned.

## 2017-07-08 NOTE — ED Notes (Signed)
[facility] have flu in their facility.

## 2017-07-08 NOTE — ED Notes (Signed)
ED Provider at bedside. 

## 2017-07-08 NOTE — ED Notes (Signed)
Pt daughter Godfrey PickLinda Fries would like to be contacted with plan. [phone]

## 2017-07-08 NOTE — Significant Event (Signed)
Called by [facility] about admission.  In brief this is a 84 year old with past medical history of dementia, hypertension, CKD, hyperlipidemia coming in with progressive onset of fever, less interactive, less appetite, possible urinary symptoms, possible hypoxia.  Vitals notable for mild tachycardia, hypertension, T-max of 100.2.  Labs are notable for an elevated white count of 12.0.  Creatinine of 1.5 with an elevated BUN.  Sodium is 146.  Urine does not look overtly infected.  Chest x-ray does not look overtly abnormal.  Unclear source of sepsis however being broadly treated for hospital-acquired pneumonia.  Currently on 2 L and satting 96% comfortably.

## 2017-07-08 NOTE — ED Notes (Signed)
Pt unable to sign transfer consent due to hx of advanced dementia. Family notified of pt's transfer

## 2017-07-08 NOTE — ED Provider Notes (Signed)
[facility] EMERGENCY DEPARTMENT Provider Note   CSN: [id number] Arrival date & time: 06/25/2017  1521     History   Chief Complaint Chief Complaint  Patient presents with  . Fever    HPI Veronica Rivas is a 84 y.o. female with history of diabetes, dementia, attention, CKD, neuropathy, presenting via EMS with 1 week of progressive onset urinary symptoms, fever, hypoxia.  Per EMS patient had been having a decreased appetite and refusing medications today.     HPI  Past Medical History:  Diagnosis Date  . Conversion disorder with attacks or seizures   . Dementia   . Diabetes mellitus without complication (HCC)    type 2  . High cholesterol   . Hypertension   . Neuropathy   . Renal disorder    chronic kidney disease  . Vitamin D deficiency     Patient Active Problem List   Diagnosis Date Noted  . Dementia with behavioral disturbance 04/19/2017    Past Surgical History:  Procedure Laterality Date  . ABDOMINAL HYSTERECTOMY    . APPENDECTOMY      OB History    No data available       Home Medications    Prior to Admission medications   Medication Sig Start Date End Date Taking? Authorizing Provider  cyanocobalamin 1000 MCG tablet Take 1,000 mcg by mouth daily.    [provider]  donepezil (ARICEPT) 10 MG tablet Take 10 mg by mouth at bedtime.    [provider]  lisinopril (PRINIVIL,ZESTRIL) 20 MG tablet Take 20 mg by mouth daily.    [provider]  loperamide (IMODIUM A-D) 2 MG tablet Take 2 mg by mouth 4 (four) times daily as needed for diarrhea or loose stools.    [provider]  Memantine HCl ER 21 MG CP24 Take by mouth.    [provider]  metoprolol tartrate (LOPRESSOR) 100 MG tablet Take 100 mg by mouth 2 (two) times daily.    [provider]  Multiple Vitamin (MULTIVITAMIN) tablet Take 1 tablet by mouth daily.    [provider]  potassium chloride SA (K-DUR,KLOR-CON) 20 MEQ  tablet Take 20 mEq by mouth daily.    [provider]  sertraline (ZOLOFT) 100 MG tablet Take 1 tablet (100 mg total) by mouth at bedtime. 04/17/17   Van Clines, MD  simvastatin (ZOCOR) 10 MG tablet Take 10 mg by mouth daily.    [provider]    Family History No family history on file.  Social History Social History   Tobacco Use  . Smoking status: Never Smoker  . Smokeless tobacco: Never Used  Substance Use Topics  . Alcohol use: No    Frequency: Never  . Drug use: No     Allergies   Patient has no known allergies.   Review of Systems Review of Systems  Unable to perform ROS: Dementia     Physical Exam Updated Vital Signs BP (!) 188/95 (BP Location: Right Arm)   Pulse (!) 110   Temp 100.1 F (37.8 C) (Rectal)   Resp (!) 32   Wt 73.9 kg (163 lb)   SpO2 95%   Physical Exam  Constitutional: She appears well-developed and well-nourished. No distress.  Nontoxic appearing in no acute distress, sitting comfortably in bed  HENT:  Head: Atraumatic.  Eyes: EOM are normal.  Neck: Normal range of motion.  Cardiovascular: Normal rate.  Pulmonary/Chest: Effort normal.  Musculoskeletal: Normal range of motion.  Neurological: She is alert. She has normal strength. GCS eye subscore is 3. GCS verbal subscore is 2. GCS motor subscore is 6.  Oriented to person and place, baseline for patient per nursing facility no decrease in mental status.  Skin: Skin is warm and dry. No rash noted. She is not diaphoretic. No erythema. No pallor.  Psychiatric: She has a normal mood and affect. Her behavior is normal.  Nursing note and vitals reviewed.    ED Treatments / Results  Labs (all labs ordered are listed, but only abnormal results are displayed) Labs Reviewed  COMPREHENSIVE METABOLIC PANEL - Abnormal; Notable for the following components:      Result Value   Sodium 146 (*)    Glucose, Bld 172 (*)    BUN 31 (*)    Creatinine, Ser 1.50 (*)    Albumin  3.2 (*)    GFR calc non Af Amer 30 (*)    GFR calc Af Amer 35 (*)    All other components within normal limits  CBC WITH DIFFERENTIAL/PLATELET - Abnormal; Notable for the following components:   WBC 12.0 (*)    Neutro Abs 8.6 (*)    Monocytes Absolute 2.4 (*)    All other components within normal limits  URINALYSIS, ROUTINE W REFLEX MICROSCOPIC - Abnormal; Notable for the following components:   Specific Gravity, Urine >1.030 (*)    Hgb urine dipstick TRACE (*)    Ketones, ur 15 (*)    Protein, ur 30 (*)    All other components within normal limits  URINALYSIS, MICROSCOPIC (REFLEX) - Abnormal; Notable for the following components:   Bacteria, UA MANY (*)    Squamous Epithelial / LPF 0-5 (*)    All other components within normal limits  I-STAT CG4 LACTIC ACID, ED - Abnormal; Notable for the following components:   Lactic Acid, Venous 1.91 (*)    All other components within normal limits  CULTURE, BLOOD (ROUTINE X 2)  CULTURE, BLOOD (ROUTINE X 2)  URINE CULTURE  INFLUENZA PANEL BY PCR (TYPE A & B)  LACTIC ACID, PLASMA    EKG  EKG Interpretation  Date/Time:  Sunday 2017-07-23 16:03:55 EST Ventricular Rate:  108 PR Interval:    QRS Duration: 130 QT Interval:  348 QTC Calculation: 467 R Axis:   -45 Text Interpretation:  Atrial flutter with predominant 3:1 AV block RBBB and LAFB Confirmed by Pricilla Loveless [phone]) on 23-Jul-2017 7:05:26 PM       Radiology Dg Chest Port 1 View  Result Date: 07-23-2017 CLINICAL DATA:  Fever EXAM: PORTABLE CHEST 1 VIEW COMPARISON:  06/21/2017 chest radiograph. FINDINGS: Stable cardiomediastinal silhouette with normal heart size. No pneumothorax. No pleural effusion. No pulmonary edema. No acute consolidative airspace disease. Stable minimal scarring at the left lung base. Suggestion of increased sclerosis in the T12 vertebral body. IMPRESSION: No acute cardiopulmonary disease.  Stable left basilar scarring. Suggestion of increased sclerosis  in the T12 vertebral body. Consider further evaluation with dedicated thoracic spine radiographs or thoracic spine MRI without and with IV contrast. Electronically Signed   By: Delbert Phenix M.D.   On: 23-Jul-2017 16:34    Procedures Procedures (including critical care time) CRITICAL CARE Performed by: Georgiana Shore   Total critical care time: 30 minutes  Critical care time was exclusive of separately billable procedures and treating other patients.  Critical care was necessary to treat or prevent imminent or life-threatening deterioration.  Critical care was time spent personally by me  on the following activities: development of treatment plan with patient and/or surrogate as well as nursing, discussions with consultants, evaluation of patient's response to treatment, examination of patient, obtaining history from patient or surrogate, ordering and performing treatments and interventions, ordering and review of laboratory studies, ordering and review of radiographic studies, pulse oximetry and re-evaluation of patient's condition.  Medications Ordered in ED Medications  sodium chloride 0.9 % bolus 1,000 mL (not administered)  acetaminophen (TYLENOL) tablet 650 mg (not administered)  vancomycin (VANCOCIN) IVPB 1000 mg/200 mL premix (not administered)  sodium chloride 0.9 % bolus 1,000 mL (0 mLs Intravenous Stopped 07/03/2017 1844)  cefTRIAXone (ROCEPHIN) 1 g in sodium chloride 0.9 % 100 mL IVPB (0 g Intravenous Stopped 07/10/2017 1742)    Initial Impression / Assessment and Plan / ED Course  I have reviewed the triage vital signs and the nursing notes.  Pertinent labs & imaging results that were available during my care of the patient were reviewed by me and considered in my medical decision making (see chart for details).    Patient presenting via ems with fever and one week of urinary symptoms.  Contacted patient's daughter who reports that she has been sleeping a lot more than  normal and has been coughing more over the last few days. Daughter is concerned that she has been less alert lately. She lives in a SNF and has been exposed to flu.   Per facility, she had 1 week of urinary symptoms. Patient was hypoxic at 85% on room air and now has new oxygen requirement to maintain o2 sat 97% on 2L [place].  Patient was opening her eyes to verbal and obeying commands but otherwise keeping her eyes closed.  She was given antibiotics and IV fluids and is persistently tachycardic.  Low grade fever. Given tylenol. On reassessment, patient was more alert after IV fluids.  Will call for admission for hypoxia.  Patient was discussed with Dr. Criss AlvineGoldston who has seen patient and agrees with assessment and plan.  Spoke to Dr. Clearnce SorrelPurohit who [facility].  Final Clinical Impressions(s) / ED Diagnoses   Final diagnoses:  Hypoxia  Febrile illness    ED Discharge Orders    None       Gregary CromerMitchell, Imani Fiebelkorn B, PA-C 07/07/2017 Jodelle Green1955    Goldston, Scott, MD 07/09/17 [phone]

## 2017-07-08 NOTE — ED Notes (Signed)
[facility] to transport pt at this time.  

## 2017-07-08 NOTE — H&P (Addendum)
History and Physical    Veronica Rivas WUJ:[id number] DOB: 09/23/1926 DOA: 06/26/2017  PCP: Patient, No Pcp Per   Patient coming from: Memory care facility   Chief Complaint: Weakness, altered mental status  HPI: Veronica Rivas is a 84 y.o. female with medical history significant of dementia, seizure disorder or possible conversion disorder with seizures, type 2 diabetes mellitus on oral glycemic's, hypertension, hyperlipidemia who comes in from memory care facility to med [facility] ER with decreased appetite, altered mental status, fever and Sirs.  Patient cannot provide any history due to her underlying dementia.  On review of the documentation and discussion with the patient's daughter Ms. Godfrey Pick the patient was doing well until approximately 6 weeks ago when she was moved to many memory care unit because she became progressively more agitated and was threatening to harm others and herself because her dog was taken away.  Since then she has been increasingly somnolent and has been on increasing amounts of medications for agitation.  The daughter reports that a few days ago she developed a cough as well as some congestion runny nose and she was prescribed Robitussin by the primary care physician.  Since then the daughter noted the patient became less and less interactive and responsive.  She appears to be significantly fatigued and sleeping all the time.  The daughter initially attributed this to polypharmacy.   ED Course: In the ED patient was noted to have a mildly elevated temperature to 100.2.  Her heart rate was 124 and her blood pressures were 199-93.  She is mildly hypoxic and was placed on 2 L nasal cannula.  Labs are notable for a white count 12.0.  Creatinine was 1.5 and elevated BUN.  Her sodium was 146.  Lactate was 1.91.  UA was not suggestive of infection.  Chest x-ray did not show any clear pneumonia.  Patient was transferred to [facility] for concern for  sepsis.  Review of Systems: As per HPI otherwise 10 point review of systems negative.    Past Medical History:  Diagnosis Date  . Conversion disorder with attacks or seizures   . Dementia   . Diabetes mellitus without complication (HCC)    type 2  . High cholesterol   . HLD (hyperlipidemia) 07/01/2017  . HTN (hypertension) 07/07/2017  . Hypertension   . Neuropathy   . Renal disorder    chronic kidney disease  . T2DM (type 2 diabetes mellitus) (HCC) 07/02/2017  . Vitamin D deficiency     Past Surgical History:  Procedure Laterality Date  . ABDOMINAL HYSTERECTOMY    . APPENDECTOMY       reports that  has never smoked. she has never used smokeless tobacco. She reports that she does not drink alcohol or use drugs.  No Known Allergies  No family history on file.  Prior to Admission medications   Medication Sig Start Date End Date Taking? Authorizing Provider  cyanocobalamin 1000 MCG tablet Take 1,000 mcg by mouth daily.    [provider]  donepezil (ARICEPT) 10 MG tablet Take 10 mg by mouth at bedtime.    [provider]  lisinopril (PRINIVIL,ZESTRIL) 20 MG tablet Take 20 mg by mouth daily.    [provider]  loperamide (IMODIUM A-D) 2 MG tablet Take 2 mg by mouth 4 (four) times daily as needed for diarrhea or loose stools.    [provider]  Memantine HCl ER 21 MG CP24 Take by mouth.    [provider]  metoprolol tartrate (LOPRESSOR) 100 MG tablet Take 100 mg by mouth 2 (two) times daily.    [provider]  Multiple Vitamin (MULTIVITAMIN) tablet Take 1 tablet by mouth daily.    [provider]  potassium chloride SA (K-DUR,KLOR-CON) 20 MEQ tablet Take 20 mEq by mouth daily.    [provider]  sertraline (ZOLOFT) 100 MG tablet Take 1 tablet (100 mg total) by mouth at bedtime. 04/17/17   Van ClinesAquino, Karen M, MD  simvastatin (ZOCOR) 10 MG tablet Take 10 mg by mouth daily.    [provider]     Physical Exam: Vitals:   07/13/2017 2015 07/10/2017 2022 07/04/2017 2030 07/01/2017 2045  BP: (!) 167/83  (!) 179/88 (!) 162/76  Pulse: (!) 109  (!) 105 93  Resp: (!) 24  20 (!) 25  Temp:      TempSrc:      SpO2: 91%  94% 93%  Weight:      Height:  5\' 6"  (1.676 m)      Constitutional: NAD, calm, comfortable Vitals:   07/10/2017 2015 07/14/2017 2022 07/15/2017 2030 06/30/2017 2045  BP: (!) 167/83  (!) 179/88 (!) 162/76  Pulse: (!) 109  (!) 105 93  Resp: (!) 24  20 (!) 25  Temp:      TempSrc:      SpO2: 91%  94% 93%  Weight:      Height:  5\' 6"  (1.676 m)     Eyes: Closed, anicteric sclera, ENMT: Dry mucous membranes, good dentition Neck: normal, supple, Respiratory: clear to auscultation bilaterally, no wheezing, no crackles. Normal respiratory effort. No accessory muscle use.  Cardiovascular: Regular rate and rhythm, no murmurs Abdomen: no tenderness, no masses palpated. No hepatosplenomegaly. Bowel sounds positive.  Musculoskeletal: Lower extremity edema Skin: no rashes visible skin Neurologic: Fully intact moving all extremities Psychiatric: Patient does not respond to any queries or questions.   Labs on Admission: I have personally reviewed following labs and imaging studies  CBC: Recent Labs  Lab 07/10/2017 1555  WBC 12.0*  NEUTROABS 8.6*  HGB 12.1  HCT 38.6  MCV 97.7  PLT PLATELET CLUMPS NOTED ON SMEAR, COUNT APPEARS ADEQUATE   Basic Metabolic Panel: Recent Labs  Lab 07/10/2017 1555  NA 146*  K 3.7  CL 106  CO2 28  GLUCOSE 172*  BUN 31*  CREATININE 1.50*  CALCIUM 9.8   GFR: Estimated Creatinine Clearance: 26.6 mL/min (A) (by C-G formula based on SCr of 1.5 mg/dL (H)). Liver Function Tests: Recent Labs  Lab 07/04/2017 1555  AST 24  ALT 14  ALKPHOS 66  BILITOT 0.5  PROT 7.2  ALBUMIN 3.2*   No results for input(s): LIPASE, AMYLASE in the last 168 hours. No results for input(s): AMMONIA in the last 168 hours. Coagulation Profile: No results for input(s):  INR, PROTIME in the last 168 hours. Cardiac Enzymes: No results for input(s): CKTOTAL, CKMB, CKMBINDEX, TROPONINI in the last 168 hours. BNP (last 3 results) No results for input(s): PROBNP in the last 8760 hours. HbA1C: No results for input(s): HGBA1C in the last 72 hours. CBG: No results for input(s): GLUCAP in the last 168 hours. Lipid Profile: No results for input(s): CHOL, HDL, LDLCALC, TRIG, CHOLHDL, LDLDIRECT in the last 72 hours. Thyroid Function Tests: No results for input(s): TSH, T4TOTAL, FREET4, T3FREE, THYROIDAB in the last 72 hours. Anemia Panel: No results for input(s): VITAMINB12, FOLATE, FERRITIN, TIBC, IRON, RETICCTPCT in the last 72 hours. Urine analysis:  Component Value Date/Time   COLORURINE YELLOW 07/18/2017 1555   APPEARANCEUR CLEAR July 11, 2017 1555   LABSPEC >1.030 (H) 07/09/2017 1555   PHURINE 5.5 07/01/2017 1555   GLUCOSEU NEGATIVE 07/06/2017 1555   HGBUR TRACE (A) 07/13/2017 1555   BILIRUBINUR NEGATIVE 07/10/2017 1555   KETONESUR 15 (A) 11-Jul-2017 1555   PROTEINUR 30 (A) 07/05/2017 1555   NITRITE NEGATIVE Jul 11, 2017 1555   LEUKOCYTESUR NEGATIVE Jul 11, 2017 1555    Radiological Exams on Admission: Dg Chest Port 1 View  Result Date: 06/25/2017 CLINICAL DATA:  Fever EXAM: PORTABLE CHEST 1 VIEW COMPARISON:  06/21/2017 chest radiograph. FINDINGS: Stable cardiomediastinal silhouette with normal heart size. No pneumothorax. No pleural effusion. No pulmonary edema. No acute consolidative airspace disease. Stable minimal scarring at the left lung base. Suggestion of increased sclerosis in the T12 vertebral body. IMPRESSION: No acute cardiopulmonary disease.  Stable left basilar scarring. Suggestion of increased sclerosis in the T12 vertebral body. Consider further evaluation with dedicated thoracic spine radiographs or thoracic spine MRI without and with IV contrast. Electronically Signed   By: Delbert Phenix M.D.   On: 06/29/2017 16:34    EKG: Independently  reviewed.  Atrial flutter  Assessment/Plan Principal Problem:   Sepsis (HCC) Active Problems:   Dementia with behavioral disturbance   HTN (hypertension)   HLD (hyperlipidemia)   T2DM (type 2 diabetes mellitus) (HCC)   ##) Sepsis from unclear source: Patient did meet sepsis criteria based on elevated white blood cell count, fever, tachycardia.  Her source is urine is unlikely as her UA is fairly bland.  Suspect based on report of daughter that pulmonary source is most likely. -Continue cefepime and vancomycin, consider narrowing rapidly as patient is clinically improving -IV fluids -Follow-up blood cultures and urine cultures -Repeat PA and lateral chest x-ray in the morning  ##) Atrial flutter: Noted on EKG done during [facility] visit.  Patient has no history of this in the chart.  Patient be a very poor candidate for anticoagulation and so we will hold on this for now. -No telemetry at this time -Obtain echo -Continue metoprolol tartrate 100 mg twice daily  ##) Lethargy/altered mental status: Suspect related to polypharmacy.  Patient on review of her medication list at her facility is on Risperdal and, lorazepam, donepezil, sertraline, divalproex. -Hold these medications  ##) Dementia: Review of the chart appears to be vascular dementia however uncertain at this time -Continue donepezil 10 mg nightly  -hold memantine pending correct dose  ##) Type 2 diabetes: -Hold metformin -Insulin sliding seizures  ##)Hyperlipidemia: Patient be an excellent candidate to discontinue statins at this time -Continue simvastatin 10 mg nightly  Fluids: Gentle IV fluids Elect lites: Monitor and supplement Impression: Regular diet  Prophylaxis: Inox apparent  Disposition: Pending improved mental status and resolution SIRS likely back to memory care versus skilled nursing facility  DO NOT RESUSCITATE  Delaine Lame MD [facility]   If 7PM-7AM, please contact  night-coverage [email or web address] Password [facility]  06/24/2017, 10:11 PM

## 2017-07-08 NOTE — Progress Notes (Signed)
Pharmacy Antibiotic Note  Veronica ParishDolores Rivas is a 84 y.o. female admitted on 07/10/2017 with pneumonia and sepsis.  Pharmacy has been consulted for Vancomycin, cefepime dosing.  Plan: Cefepime 1gm iv q24hr  Vancomycin 1gm iv q48hr Goal AUC = 400 - 500 for all indications, except meningitis (goal AUC > 500 and Cmin 15-20 mcg/mL)   Height: 5\' 6"  (167.6 cm) Weight: 159 lb 6.3 oz (72.3 kg) IBW/kg (Calculated) : 59.3  Temp (24hrs), Avg:99.6 F (37.6 C), Min:98.6 F (37 C), Max:100.2 F (37.9 C)  Recent Labs  Lab 06/27/2017 1555 07/16/2017 1603 07/14/2017 1931  WBC 12.0*  --   --   CREATININE 1.50*  --   --   LATICACIDVEN  --  1.91* 0.81    Estimated Creatinine Clearance: 26.4 mL/min (A) (by C-G formula based on SCr of 1.5 mg/dL (H)).    No Known Allergies  Antimicrobials this admission: Vancomycin 06/25/2017 >> Cefepime 07/19/2017 >>   Dose adjustments this admission: -  Microbiology results: pending  Thank you for allowing pharmacy to be a part of this patient's care.  Veronica DavidsonGrimsley Veronica Rivas, Veronica Rivas 07/06/2017 10:50 PM

## 2017-07-09 ENCOUNTER — Inpatient Hospital Stay (HOSPITAL_COMMUNITY): Payer: Medicare HMO

## 2017-07-09 DIAGNOSIS — I361 Nonrheumatic tricuspid (valve) insufficiency: Secondary | ICD-10-CM

## 2017-07-09 DIAGNOSIS — J101 Influenza due to other identified influenza virus with other respiratory manifestations: Principal | ICD-10-CM

## 2017-07-09 LAB — ECHOCARDIOGRAM COMPLETE
Height: 66 in
Weight: 2550.28 oz

## 2017-07-09 LAB — BASIC METABOLIC PANEL
Anion gap: 9 (ref 5–15)
BUN: 25 mg/dL — ABNORMAL HIGH (ref 6–20)
CO2: 26 mmol/L (ref 22–32)
Calcium: 8.7 mg/dL — ABNORMAL LOW (ref 8.9–10.3)
Creatinine, Ser: 1.11 mg/dL — ABNORMAL HIGH (ref 0.44–1.00)
GFR calc non Af Amer: 43 mL/min — ABNORMAL LOW (ref >60)
Glucose, Bld: 169 mg/dL — ABNORMAL HIGH (ref 65–99)

## 2017-07-09 LAB — CBC
HCT: 33.7 % — ABNORMAL LOW (ref 36.0–46.0)
Hemoglobin: 10.6 g/dL — ABNORMAL LOW (ref 12.0–15.0)
MCH: 30.5 pg (ref 26.0–34.0)
MCHC: 31.5 g/dL (ref 30.0–36.0)
MCV: 96.8 fL (ref 78.0–100.0)
Platelets: 157 10*3/uL (ref 150–400)
RBC: 3.48 MIL/uL — ABNORMAL LOW (ref 3.87–5.11)
RDW: 13.6 % (ref 11.5–15.5)
WBC: 10.3 K/uL (ref 4.0–10.5)

## 2017-07-09 LAB — GLUCOSE, CAPILLARY
Glucose-Capillary: 153 mg/dL — ABNORMAL HIGH (ref 65–99)
Glucose-Capillary: 152 mg/dL — ABNORMAL HIGH (ref 65–99)
Glucose-Capillary: 230 mg/dL — ABNORMAL HIGH (ref 65–99)
Glucose-Capillary: 70 mg/dL (ref 65–99)
Glucose-Capillary: 171 mg/dL — ABNORMAL HIGH (ref 65–99)
Glucose-Capillary: 213 mg/dL — ABNORMAL HIGH (ref 65–99)

## 2017-07-09 LAB — BASIC METABOLIC PANEL WITH GFR
Chloride: 110 mmol/L (ref 101–111)
GFR calc Af Amer: 50 mL/min — ABNORMAL LOW (ref >60)
Potassium: 3.1 mmol/L — ABNORMAL LOW (ref 3.5–5.1)
Sodium: 145 mmol/L (ref 135–145)

## 2017-07-09 LAB — INFLUENZA PANEL BY PCR (TYPE A & B)
INFLAPCR: POSITIVE — AB
INFLBPCR: NEGATIVE

## 2017-07-09 MED ORDER — ENOXAPARIN SODIUM 40 MG/0.4ML ~~LOC~~ SOLN
40.0000 mg | Freq: Every day | SUBCUTANEOUS | Status: DC
  Administered 2017-07-10: 40 mg via SUBCUTANEOUS
  Filled 2017-07-09 (×2): qty 0.4

## 2017-07-09 MED ORDER — STARCH (THICKENING) PO POWD
ORAL | Status: DC | PRN
  Filled 2017-07-09: qty 227

## 2017-07-09 MED ORDER — ORAL CARE MOUTH RINSE
15.0000 mL | Freq: Two times a day (BID) | OROMUCOSAL | Status: DC
  Administered 2017-07-09 – 2017-07-10 (×3): 15 mL via OROMUCOSAL

## 2017-07-09 MED ORDER — CHLORHEXIDINE GLUCONATE 0.12 % MT SOLN
15.0000 mL | Freq: Two times a day (BID) | OROMUCOSAL | Status: DC
  Administered 2017-07-09 – 2017-07-10 (×4): 15 mL via OROMUCOSAL
  Filled 2017-07-09 (×4): qty 15

## 2017-07-09 MED ORDER — OSELTAMIVIR PHOSPHATE 30 MG PO CAPS
30.0000 mg | ORAL_CAPSULE | Freq: Two times a day (BID) | ORAL | Status: DC
  Administered 2017-07-09 – 2017-07-10 (×4): 30 mg via ORAL
  Filled 2017-07-09 (×5): qty 1

## 2017-07-09 NOTE — Clinical Social Work Note (Signed)
Clinical Social Work Assessment  Patient Details  Name: Veronica Rivas MRN: [id number] Date of Birth: May 03, 1927  Date of referral:  07/09/17               Reason for consult:  Discharge Planning(pt admitted from facility)                Permission sought to share information with:  Family Supports Permission granted to share information::     Name::     daughter Veronica Rivas, daughter Veronica Rivas and son in Engineer, manufacturing::  [place]   Relationship::     Contact Information:     Housing/Transportation Living arrangements for the past 2 months:  Assisted Dealer of Information:  Adult Children Patient Interpreter Needed:  None Criminal Activity/Legal Involvement Pertinent to Current Situation/Hospitalization:  No - Comment as needed Significant Relationships:  Adult Children, Merchandiser, retail Lives with:  Facility Resident Do you feel safe going back to the place where you live?  Yes Need for family participation in patient care:  Yes (Comment)(pt with dementia, not oriented)  Care giving concerns:  Pt from [facility] memory care. Has lived there about 1.5 months. Moved there after being hospitalized at geropsych unit at [facility] late 2018. Daughter states, "She started threatening to kill herself and others saying we had taken her dog away." States prior to that pt lived in ALF but not the memory care section- states her cognition has significantly worsened since that time and moved into the memory care building. States pt was doing okay with ambulation and ADLs- needs prompting and guidance at baseline and uses walker. Currently requiring maximum assist.  Daughter and son-in-law feel hopeful that rehab could help pt progress with functioning enough to return to [place], however they are also considering the possibility that "this is just a general decline and she may need long-term SNF."   Social Worker assessment / plan:  CSW consulted to assist with  disposition plan.  Pt is from memory care unit at [facility], and according to family is not at her baseline with ambulation/adls. Feel SNF rehab is best care option for her but per daughters and son-in-law, they are beginning to plan for long term SNF as well, feeling pt's decline over the past few weeks is "not something she may come back from. She has progressive dementia and we've noticed a huge difference in the last couple of months." Pt's son-in-law reports he manages pt's finances and is prepared to pay privately for care at SNF if needed, with hopes of eventually having pt qualify for medicaid (they have not applied but are looking to once pt's funds have decreased).  They report pt has never been to SNF before- CSW explained placement and insurance auth request process. Fmaily agreed to referrals.  CSW completed FL2 and made referrals to area SNFs for rehab noting that family may private pay if [facility] Harvey denies or if ST rehab progresses to LTC once admitted at a SNF.  Pt's PASSR request went to manual review- will provide information when requested. Pt cannot admit to SNF until passr approved.   Plan: SNF at [place]. Barriers: Passr, bed offers, insurance auth.   Employment status:  Retired Database administrator PT Recommendations:  Skilled Holiday representative, 24 Hour Supervision Information / Referral to community resources:  Skilled Nursing Facility  Patient/Family's Response to care:  Family very engaged and appreciative  Patient/Family's Understanding of and Emotional Response to  Diagnosis, Current Treatment, and Prognosis:  Pt oriented to self but not very interactive with CSW. Poor historian per chart. Both daughters and son-in-law seem very understanding of pt's treatment, asking pertinent questions and express being familiar with SNF/healthcare needs due to other family having experiences. Are emotionally well-adjusted but daughter reports "this is exhausting  for us, she has been through so many facility transitions lately."  Emotional Assessment Appearance:  Appears stated age Attitude/Demeanor/Rapport:  (unremarkable) Affect (typically observed):  Calm Orientation:  Oriented to Self Alcohol / Substance use:  Not Applicable Psych involvement (Current and /or in the community):  No (Comment)  Discharge Needs  Concerns to be addressed:  Decision making concerns, Discharge Planning Concerns Readmission within the last 30 days:  No Current discharge risk:  Dependent with Mobility, Cognitively Impaired Barriers to Discharge:  Continued Medical Work up, [employer] Authorization   Reilly Molchan R Bristol Soy, LCSW 07/09/2017, 4:41 PM [phone]

## 2017-07-09 NOTE — Progress Notes (Signed)
  Echocardiogram 2D Echocardiogram has been performed.  Celene SkeenVijay  Miho Monda 07/09/2017, 4:03 PM

## 2017-07-09 NOTE — NC FL2 (Signed)
[place] MEDICAID FL2 LEVEL OF CARE SCREENING TOOL     IDENTIFICATION  Patient Name: Veronica Rivas Birthdate: Apr 20, 1927 Sex: female Admission Date (Current Location): 03/30/18  [facility] [place]   Facility and Address:  [facility],  [address], [place] [id number]      Provider Number: [id number]  Attending Physician Name and Address:  Shon HaleEmokpae, Courage, MD  Relative Name and Phone Number:       Current Level of Care: Hospital Recommended Level of Care: Skilled Nursing Facility Prior Approval Number:    Date Approved/Denied:   PASRR Number:    Discharge Plan: SNF    Current Diagnoses: Patient Active Problem List   Diagnosis Date Noted  . Sepsis (HCC) 011/09/19  . HTN (hypertension) 011/09/19  . HLD (hyperlipidemia) 011/09/19  . T2DM (type 2 diabetes mellitus) (HCC) 011/09/19  . Dementia with behavioral disturbance 04/19/2017    Orientation RESPIRATION BLADDER Height & Weight     Self  O2(2L) Incontinent Weight: 159 lb 6.3 oz (72.3 kg) Height:  5\' 6"  (167.6 cm)  BEHAVIORAL SYMPTOMS/MOOD NEUROLOGICAL BOWEL NUTRITION STATUS      Continent Diet(dysphasia 3, nectar-thick fluids)  AMBULATORY STATUS COMMUNICATION OF NEEDS Skin   Extensive Assist Verbally Normal                       Personal Care Assistance Level of Assistance  Bathing, Feeding, Dressing Bathing Assistance: Maximum assistance Feeding assistance: Limited assistance Dressing Assistance: Maximum assistance     Functional Limitations Info  Sight, Hearing, Speech Sight Info: Adequate Hearing Info: Adequate Speech Info: Adequate    SPECIAL CARE FACTORS FREQUENCY  PT (By licensed PT), OT (By licensed OT)     PT Frequency: 5x OT Frequency: 5x            Contractures Contractures Info: Not present    Additional Factors Info  Code Status, Allergies Code Status Info: DNR Allergies Info: nka           Current Medications (07/09/2017):   This is the current hospital active medication list Current Facility-Administered Medications  Medication Dose Route Frequency Provider Last Rate Last Dose  . acetaminophen (TYLENOL) tablet 650 mg  650 mg Oral Q6H PRN Purohit, Salli QuarryShrey C, MD       Or  . acetaminophen (TYLENOL) suppository 650 mg  650 mg Rectal Q6H PRN Purohit, Shrey C, MD      . ceFEPIme (MAXIPIME) 1 g in sodium chloride 0.9 % 100 mL IVPB  1 g Intravenous QHS Purohit, Salli QuarryShrey C, MD   Stopped at 07/09/17 0012  . chlorhexidine (PERIDEX) 0.12 % solution 15 mL  15 mL Mouth Rinse BID Purohit, Shrey C, MD   15 mL at 07/09/17 1049  . dextrose 5 %-0.45 % sodium chloride infusion   Intravenous Continuous Purohit, Shrey C, MD 100 mL/hr at 07/09/17 1023    . donepezil (ARICEPT) tablet 10 mg  10 mg Oral QHS Purohit, Shrey C, MD      . enoxaparin (LOVENOX) injection 40 mg  40 mg Subcutaneous QHS Herby AbrahamBell, Michelle T, RPH      . food thickener (THICK IT) powder   Oral PRN Emokpae, Courage, MD      . insulin aspart (novoLOG) injection 0-9 Units  0-9 Units Subcutaneous TID WC Purohit, Salli QuarryShrey C, MD   3 Units at 07/09/17 1313  . lisinopril (PRINIVIL,ZESTRIL) tablet 20 mg  20 mg Oral Daily Purohit, Salli QuarryShrey C, MD  20 mg at 07/09/17 1058  . MEDLINE mouth rinse  15 mL Mouth Rinse q12n4p Purohit, Shrey C, MD   15 mL at 07/09/17 1550  . metoprolol tartrate (LOPRESSOR) tablet 100 mg  100 mg Oral BID Purohit, Shrey C, MD   100 mg at 07/09/17 1050  . ondansetron (ZOFRAN) tablet 4 mg  4 mg Oral Q6H PRN Purohit, Shrey C, MD       Or  . ondansetron (ZOFRAN) injection 4 mg  4 mg Intravenous Q6H PRN Purohit, Shrey C, MD      . oseltamivir (TAMIFLU) capsule 30 mg  30 mg Oral BID Emokpae, Courage, MD   30 mg at 07/09/17 1300  . polyethylene glycol (MIRALAX / GLYCOLAX) packet 17 g  17 g Oral Daily PRN Purohit, Shrey C, MD      . sertraline (ZOLOFT) tablet 100 mg  100 mg Oral QHS Purohit, Shrey C, MD      . simvastatin (ZOCOR) tablet 10 mg  10 mg Oral Daily Purohit, Shrey  C, MD   10 mg at 07/09/17 1050  . vitamin B-12 (CYANOCOBALAMIN) tablet 1,000 mcg  1,000 mcg Oral Daily Purohit, Salli Quarry, MD   1,000 mcg at 07/09/17 1049     Discharge Medications: Please see discharge summary for a list of discharge medications.  Relevant Imaging Results:  Relevant Lab Results:   Additional Information SS# [id number]  Nelwyn Salisbury, LCSW

## 2017-07-09 NOTE — Evaluation (Signed)
Clinical/Bedside Swallow Evaluation Patient Details  Name: Veronica Rivas MRN: [id number] Date of Birth: Dec 28, 1926  Today's Date: 07/09/2017 Time: SLP Start Time (ACUTE ONLY): 0856 SLP Stop Time (ACUTE ONLY): 0917 SLP Time Calculation (min) (ACUTE ONLY): 21 min  Past Medical History:  Past Medical History:  Diagnosis Date  . Conversion disorder with attacks or seizures   . Dementia   . Diabetes mellitus without complication (HCC)    type 2  . High cholesterol   . HLD (hyperlipidemia) 06/25/2017  . HTN (hypertension) 07/10/2017  . Hypertension   . Neuropathy   . Renal disorder    chronic kidney disease  . T2DM (type 2 diabetes mellitus) (HCC) 07/19/2017  . Vitamin D deficiency    Past Surgical History:  Past Surgical History:  Procedure Laterality Date  . ABDOMINAL HYSTERECTOMY    . APPENDECTOMY     HPI:  Veronica Rivas a 84 y.o.femalewith medical history significant ofdementia, seizure disorder or possible conversion disorder with seizures, type 2 diabetes mellitus, hypertension, hyperlipidemia who comes in from memory care facility with decreased appetite, altered mental status, fever and Sirs. Chest x-ray did not show any clear pneumonia   Assessment / Plan / Recommendation Clinical Impression  Suspected pharyngeal dysphagia with consistent immediate and delayed cough following thin liquids. Pt has hx of GERD with eructation noted this assessment.   SLP Visit Diagnosis: Dysphagia, oropharyngeal phase (R13.12)    Aspiration Risk  Moderate aspiration risk    Diet Recommendation Dysphagia 3 (Mech soft);Nectar-thick liquid   Liquid Administration via: Cup;No straw Medication Administration: Crushed with puree Supervision: Patient able to self feed;Staff to assist with self feeding;Full supervision/cueing for compensatory strategies Compensations: Slow rate;Small sips/bites;Minimize environmental distractions;Lingual sweep for clearance of pocketing Postural Changes:  Seated upright at 90 degrees    Other  Recommendations Oral Care Recommendations: Oral care BID Other Recommendations: Order thickener from pharmacy   Follow up Recommendations (TBD)      Frequency and Duration min 2x/week  2 weeks       Prognosis Prognosis for Safe Diet Advancement: Fair Barriers to Reach Goals: Cognitive deficits      Swallow Study   General HPI: Veronica Rivas a 84 y.o.femalewith medical history significant ofdementia, seizure disorder or possible conversion disorder with seizures, type 2 diabetes mellitus, hypertension, hyperlipidemia who comes in from memory care facility with decreased appetite, altered mental status, fever and Sirs. Chest x-ray did not show any clear pneumonia Type of Study: Bedside Swallow Evaluation Previous Swallow Assessment: none Diet Prior to this Study: Regular;Thin liquids Temperature Spikes Noted: No Respiratory Status: Nasal cannula History of Recent Intubation: No Behavior/Cognition: Cooperative;Requires cueing;Alert Oral Cavity Assessment: Dry Oral Care Completed by SLP: No Oral Cavity - Dentition: Adequate natural dentition Vision: Functional for self-feeding Self-Feeding Abilities: Able to feed self;Needs assist;Needs set up Patient Positioning: Upright in bed Baseline Vocal Quality: Normal Volitional Cough: Weak    Oral/Motor/Sensory Function Overall Oral Motor/Sensory Function: Within functional limits   Ice Chips Ice chips: Not tested   Thin Liquid Thin Liquid: Impaired Presentation: Cup;Straw Oral Phase Impairments: Reduced labial seal Oral Phase Functional Implications: Right anterior spillage;Left anterior spillage Pharyngeal  Phase Impairments: Cough - Immediate;Cough - Delayed    Nectar Thick Nectar Thick Liquid: Not tested   Honey Thick Honey Thick Liquid: Not tested   Puree Puree: Impaired Oral Phase Functional Implications: Prolonged oral transit Pharyngeal Phase Impairments: (none)    Solid   GO   Solid: Impaired Oral Phase Impairments: Reduced lingual movement/coordination  Royce MacadamiaLitaker, Anaka Beazer Willis 07/09/2017,9:24 AM  Breck CoonsLisa Willis Lonell FaceLitaker M.Ed [employer]-SLP Pager [phone]

## 2017-07-09 NOTE — Progress Notes (Signed)
PT Cancellation Note  Patient Details Name: Loletta ParishDolores Moten MRN: [id number] DOB: 08-15-1926   Cancelled Treatment:    Reason Eval/Treat Not Completed: Other (comment) Attempted to evaluate patient, she was actively eating breakfast with CNA assist. Plan to attempt to return as/if able and schedule allows.    Nedra HaiKristen Merrianne Mccumbers PT, DPT, CBIS  Supplemental Physical Therapist [facility]   Pager [phone]

## 2017-07-09 NOTE — Evaluation (Signed)
Physical Therapy Evaluation Patient Details Name: Veronica Rivas MRN: [id number] DOB: 26-Jun-1926 Today's Date: 07/09/2017   History of Present Illness  84yo female from memory care facility presenting to the med center in [facility] with decreased appetite, AMS, fever, and SIRS, history of agitation. Transferred to [facility] due to concern of sepsis. Diagnosed with sepsis of unclear source, A-flutter, AMS. PMH conversion disorder, dementia, DM, HTN, neuropathy   Clinical Impression   Patient received in bed, sleeping but able to be woken, unable to provide details of PLOF/history/equipment. Attempted functional bed mobility, patient does not appear to put forth any effort to assist therapist and requires Max-TotalA for rolling. Attempted supine to sit with HOB elevated, patient continues to not put forth effort and appears to resist transfer to EOB. Cognitive status may very well be influencing patient's participation in mobility today. Unable to safely mobilize to EOB with assist of just +1. She was left in bed with alarm activated, all needs met this morning. Moving forward she may benefit from ST-SNF unless her memory care center is equipped to provide appropriate levels of assistance, in which case she may return to that facility.     Follow Up Recommendations SNF;Other (comment)(unless memory care facility can provide appropriate levels of assistance )    Equipment Recommendations  None recommended by PT    Recommendations for Other Services       Precautions / Restrictions Precautions Precautions: Fall;Other (comment) Precaution Comments: dementia with history of agitation and combativeness, positive for flu (droplet precautions) Restrictions Weight Bearing Restrictions: No      Mobility  Bed Mobility Overal bed mobility: Needs Assistance Bed Mobility: Rolling Rolling: Max assist         General bed mobility comments: maxA provided for rolling today with patient not  assisting at all; attempted supine to sit with HOB elevated but patient resistant to activity, unable to perform with +1   Transfers                 General transfer comment: unable to attempt safely with assist of +1   Ambulation/Gait             General Gait Details: unable to attempt safely with assist of +1   Stairs            Wheelchair Mobility    Modified Rankin (Stroke Patients Only)       Balance                                             Pertinent Vitals/Pain Pain Assessment: Faces Faces Pain Scale: Hurts a little bit Pain Location: generalized, patient unable to give specifics  Pain Intervention(s): Monitored during session;Limited activity within patient's tolerance    Home Living Family/patient expects to be discharged to:: Unsure                 Additional Comments: family not present in room to assist and provide details     Prior Function           Comments: unsure- family not present to provide clarification, patient unable      Hand Dominance        Extremity/Trunk Assessment   Upper Extremity Assessment Upper Extremity Assessment: Defer to OT evaluation    Lower Extremity Assessment Lower Extremity Assessment: Generalized weakness    Cervical / Trunk  Assessment Cervical / Trunk Assessment: Kyphotic  Communication   Communication: No difficulties  Cognition Arousal/Alertness: Lethargic Behavior During Therapy: Flat affect Overall Cognitive Status: History of cognitive impairments - at baseline                                 General Comments: history of dementia at baseline, lethargic today       General Comments General comments (skin integrity, edema, etc.): unable to get patient to sitting position with assist of +1/with patient not helping/resisting movement with therapist, unable to assess balance     Exercises     Assessment/Plan    PT Assessment Patient needs  continued PT services  PT Problem List Decreased strength;Decreased mobility;Decreased safety awareness;Decreased coordination;Decreased balance       PT Treatment Interventions DME instruction;Therapeutic activities;Gait training;Therapeutic exercise;Patient/family education;Stair training;Balance training;Functional mobility training;Neuromuscular re-education    PT Goals (Current goals can be found in the Care Plan section)  Acute Rehab PT Goals PT Goal Formulation: Patient unable to participate in goal setting Time For Goal Achievement: 07/23/17 Potential to Achieve Goals: Fair    Frequency Min 2X/week   Barriers to discharge        Co-evaluation               AM-PAC PT "6 Clicks" Daily Activity  Outcome Measure Difficulty turning over in bed (including adjusting bedclothes, sheets and blankets)?: Unable Difficulty moving from lying on back to sitting on the side of the bed? : Unable Difficulty sitting down on and standing up from a chair with arms (e.g., wheelchair, bedside commode, etc,.)?: Unable Help needed moving to and from a bed to chair (including a wheelchair)?: A Lot Help needed walking in hospital room?: Total Help needed climbing 3-5 steps with a railing? : Total 6 Click Score: 7    End of Session Equipment Utilized During Treatment: Oxygen Activity Tolerance: Patient limited by lethargy Patient left: in bed;with bed alarm set;with call bell/phone within reach   PT Visit Diagnosis: Unsteadiness on feet (R26.81);Muscle weakness (generalized) (M62.81);Difficulty in walking, not elsewhere classified (R26.2)    Time: 2706-2376 PT Time Calculation (min) (ACUTE ONLY): 16 min   Charges:   PT Evaluation $PT Eval Moderate Complexity: 1 Mod     PT G Codes:        Deniece Ree PT, DPT, CBIS  Supplemental Physical Therapist [facility]   Pager [phone]

## 2017-07-09 NOTE — Progress Notes (Signed)
Patient Demographics:    Veronica Rivas, is a 84 y.o. female, DOB - January 11, 1927, ZOX:[id number]  Admit date - 06/29/2017   Admitting Physician Delaine Lame, MD  Outpatient Primary MD for the patient is Patient, No Pcp Per  LOS - 1   Chief Complaint  Patient presents with  . Fever        Subjective:    Veronica Rivas today has no fevers, no emesis,  No chest pain,  Cough and fatigue persist   Assessment  & Plan :    Principal Problem:   Influenza A Active Problems:   Dementia with behavioral disturbance   Sepsis (HCC)   HTN (hypertension)   HLD (hyperlipidemia)   T2DM (type 2 diabetes mellitus) (HCC)   Veronica Rivas is a 84 y.o. female with medical history significant of dementia, seizure disorder or possible conversion disorder with seizures, DM2 on oral glycemic's, hypertension, hyperlipidemia  Admitted on 07/16/2017  memory care facility to med [facility] ER with decreased appetite, altered mental status, fever and Sirs.  Patient tested positive for influenza A   Plan:- 1)Influenza A-Tamiflu as ordered bronchodilators mucolytics and supportive care  2)FEN-IV fluids, encourage increased oral intake  3)DM- Use Novolog/Humalog Sliding scale insulin with Accu-Cheks/Fingersticks as ordered   4)Generalized weakness/Debility- PT/OT recommends skilled nursing facility rehab  5)SIRs-secondary to #1 above, leukocytosis is resolved, cultures negative so far, stop Vanco and cefepime, no evidence of bacterial infection at this time  Code Status : DNR   Disposition Plan  : SNF  Consults  :  PT/OT/SW  DVT Prophylaxis  :  Lovenox   Lab Results  Component Value Date   PLT 157 07/09/2017    Inpatient Medications  Scheduled Meds: . chlorhexidine  15 mL Mouth Rinse BID  . donepezil  10 mg Oral QHS  . enoxaparin (LOVENOX) injection  40 mg Subcutaneous QHS  . insulin aspart  0-9 Units  Subcutaneous TID WC  . lisinopril  20 mg Oral Daily  . mouth rinse  15 mL Mouth Rinse q12n4p  . metoprolol tartrate  100 mg Oral BID  . oseltamivir  30 mg Oral BID  . sertraline  100 mg Oral QHS  . simvastatin  10 mg Oral Daily  . cyanocobalamin  1,000 mcg Oral Daily   Continuous Infusions: . ceFEPime (MAXIPIME) IV Stopped (07/09/17 0012)  . dextrose 5 % and 0.45% NaCl 100 mL/hr at 07/09/17 1023   PRN Meds:.acetaminophen **OR** acetaminophen, food thickener, ondansetron **OR** ondansetron (ZOFRAN) IV, polyethylene glycol    Anti-infectives (From admission, onward)   Start     Dose/Rate Route Frequency Ordered Stop   07/10/17 1800  vancomycin (VANCOCIN) IVPB 1000 mg/200 mL premix  Status:  Discontinued     1,000 mg 200 mL/hr over 60 Minutes Intravenous Every 48 hours 07/12/2017 2250 07/09/17 1114   07/09/17 2000  vancomycin (VANCOCIN) IVPB 750 mg/150 ml premix  Status:  Discontinued     750 mg 150 mL/hr over 60 Minutes Intravenous Every 24 hours 07/17/2017 2028 07/07/2017 2247   07/09/17 1600  ceFEPIme (MAXIPIME) 1 g in sodium chloride 0.9 % 100 mL IVPB  Status:  Discontinued     1 g 200 mL/hr over 30 Minutes Intravenous Every 24 hours  2017-07-27 2217 Jul 27, 2017 2242   07/09/17 1230  oseltamivir (TAMIFLU) capsule 30 mg     30 mg Oral 2 times daily 07/09/17 1114 07/14/17 0959   07/27/2017 2245  ceFEPIme (MAXIPIME) 1 g in sodium chloride 0.9 % 100 mL IVPB     1 g 200 mL/hr over 30 Minutes Intravenous Daily at bedtime 07-27-17 2243     Jul 27, 2017 2230  vancomycin (VANCOCIN) 1,500 mg in sodium chloride 0.9 % 500 mL IVPB  Status:  Discontinued     1,500 mg 250 mL/hr over 120 Minutes Intravenous Every 24 hours July 27, 2017 2217 July 27, 2017 2227   07-27-2017 1915  vancomycin (VANCOCIN) IVPB 1000 mg/200 mL premix     1,000 mg 200 mL/hr over 60 Minutes Intravenous  Once Jul 27, 2017 1911 2017/07/27 2106   Jul 27, 2017 1700  cefTRIAXone (ROCEPHIN) 1 g in sodium chloride 0.9 % 100 mL IVPB     1 g 200 mL/hr over 30  Minutes Intravenous  Once 07/27/2017 1649 07-27-2017 1742        Objective:   Vitals:   07/27/2017 2045 Jul 27, 2017 2215 07/09/17 0605 07/09/17 1557  BP: (!) 162/76 (!) 144/78 (!) 167/67 109/66  Pulse: 93 88 80 63  Resp: (!) 25 (!) 22 (!) 22 20  Temp:  98.6 F (37 C) 97.8 F (36.6 C) 97.7 F (36.5 C)  TempSrc:  Oral Axillary Axillary  SpO2: 93% 98% 99% 100%  Weight:  72.3 kg (159 lb 6.3 oz)    Height:        Wt Readings from Last 3 Encounters:  Jul 27, 2017 72.3 kg (159 lb 6.3 oz)     Intake/Output Summary (Last 24 hours) at 07/09/2017 1727 Last data filed at 07/09/2017 1109 Gross per 24 hour  Intake 2401.67 ml  Output 250 ml  Net 2151.67 ml     Physical Exam  Gen:- Awake Alert,  In no apparent distress  HEENT:- [place].AT, No sclera icterus Neck-Supple Neck,No JVD,.  Lungs-scattered wheezes bilaterally and movement is fair CV- S1, S2 normal Abd-  +ve B.Sounds, Abd Soft, No tenderness,    Extremity/Skin:- No  edema,    Psych-flat affect    Data Review:   Micro Results Recent Results (from the past 240 hour(s))  Blood Culture (routine x 2)     Status: None (Preliminary result)   Collection Time: July 27, 2017  3:55 PM  Result Value Ref Range Status   Specimen Description   Final    BLOOD LEFT ANTECUBITAL Performed at [facility] [address] [place], [place] [address]    Special Requests   Final    BOTTLES DRAWN AEROBIC AND ANAEROBIC Blood Culture adequate volume Performed at [facility], [address] [place], [place] [address]    Culture   Final    NO GROWTH < 24 HOURS Performed at [facility], [address] [place], [place] [address]    Report Status PENDING  Incomplete  MRSA PCR Screening     Status: None   Collection Time: 2017-07-27 10:16 PM  Result Value Ref Range Status   MRSA by PCR NEGATIVE NEGATIVE Final    Comment:        The GeneXpert MRSA Assay (FDA approved for NASAL specimens only), is one component of  a comprehensive MRSA colonization surveillance program. It is not intended to diagnose MRSA infection nor to guide or monitor treatment for MRSA infections. Performed at [facility], [address], [place], [place] [address]  Radiology Reports X-ray Chest Pa And Lateral  Result Date: 07/09/2017 CLINICAL DATA:  Fever. EXAM: CHEST  2 VIEW COMPARISON:  Radiograph of July 08, 2017. FINDINGS: Stable cardiomediastinal silhouette. Atherosclerosis of thoracic aorta is noted. No pneumothorax or significant pleural effusion is noted. Stable left basilar scarring is noted. No acute pulmonary disease is noted. No definite fracture is noted in the thoracic spine. IMPRESSION: Aortic atherosclerosis. Stable left basilar scarring. No acute cardiopulmonary abnormality seen. Increased sclerosis of T12 vertebral body noted on prior exam is somewhat appreciated on this exam. As noted on prior exam, further evaluation with thoracic spine radiographs or thoracic spine MRI with and without IV contrast is recommended. Electronically Signed   By: Lupita RaiderJames  Green Jr, M.D.   On: 07/09/2017 08:25   Dg Chest 2 View  Result Date: 06/21/2017 CLINICAL DATA:  Altered mental status.  Hypertension. EXAM: CHEST  2 VIEW COMPARISON:  04/21/2017 FINDINGS: Lungs are adequately inflated and otherwise clear. Cardiomediastinal silhouette is within normal. There is calcified plaque over the thoracic aorta. Mild degenerate change of the spine. IMPRESSION: No active cardiopulmonary disease. Electronically Signed   By: Elberta Fortisaniel  Boyle M.D.   On: 06/21/2017 18:31   Ct Head Wo Contrast  Result Date: 06/21/2017 CLINICAL DATA:  Four falls in the last 24 hours. EXAM: CT HEAD WITHOUT CONTRAST TECHNIQUE: Contiguous axial images were obtained from the base of the skull through the vertex without intravenous contrast. COMPARISON:  None. FINDINGS: Brain: No subdural, epidural, or subarachnoid hemorrhage. Mild prominence of the  ventricles and sulci is likely due to atrophy. The cerebellum, brainstem, and basal cisterns are normal. No mass effect or midline shift. White matter changes are identified. No evidence of acute cortical ischemia or infarct. Vascular: Dense calcified atherosclerosis in the intracranial portions of the carotid arteries. Skull: Normal. Negative for fracture or focal lesion. Sinuses/Orbits: No acute finding. Other: None. IMPRESSION: No acute abnormality.  Chronic white matter changes. Electronically Signed   By: Gerome Samavid  Williams III M.D   On: 06/21/2017 18:38   Dg Chest Port 1 View  Result Date: 07/10/2017 CLINICAL DATA:  Fever EXAM: PORTABLE CHEST 1 VIEW COMPARISON:  06/21/2017 chest radiograph. FINDINGS: Stable cardiomediastinal silhouette with normal heart size. No pneumothorax. No pleural effusion. No pulmonary edema. No acute consolidative airspace disease. Stable minimal scarring at the left lung base. Suggestion of increased sclerosis in the T12 vertebral body. IMPRESSION: No acute cardiopulmonary disease.  Stable left basilar scarring. Suggestion of increased sclerosis in the T12 vertebral body. Consider further evaluation with dedicated thoracic spine radiographs or thoracic spine MRI without and with IV contrast. Electronically Signed   By: Delbert PhenixJason A Poff M.D.   On: 07/05/2017 16:34     CBC Recent Labs  Lab 07/02/2017 1555 07/09/17 0433  WBC 12.0* 10.3  HGB 12.1 10.6*  HCT 38.6 33.7*  PLT PLATELET CLUMPS NOTED ON SMEAR, COUNT APPEARS ADEQUATE 157  MCV 97.7 96.8  MCH 30.6 30.5  MCHC 31.3 31.5  RDW 13.6 13.6  LYMPHSABS 1.0  --   MONOABS 2.4*  --   EOSABS 0.0  --   BASOSABS 0.0  --     Chemistries  Recent Labs  Lab 06/26/2017 1555 07/09/17 0433  NA 146* 145  K 3.7 3.1*  CL 106 110  CO2 28 26  GLUCOSE 172* 169*  BUN 31* 25*  CREATININE 1.50* 1.11*  CALCIUM 9.8 8.7*  AST 24  --   ALT 14  --   ALKPHOS 66  --  BILITOT 0.5  --     ------------------------------------------------------------------------------------------------------------------ No results for input(s): CHOL, HDL, LDLCALC, TRIG, CHOLHDL, LDLDIRECT in the last 72 hours.  No results found for: HGBA1C ------------------------------------------------------------------------------------------------------------------ No results for input(s): TSH, T4TOTAL, T3FREE, THYROIDAB in the last 72 hours.  Invalid input(s): FREET3 ------------------------------------------------------------------------------------------------------------------ No results for input(s): VITAMINB12, FOLATE, FERRITIN, TIBC, IRON, RETICCTPCT in the last 72 hours.  Coagulation profile No results for input(s): INR, PROTIME in the last 168 hours.  No results for input(s): DDIMER in the last 72 hours.  Cardiac Enzymes No results for input(s): CKMB, TROPONINI, MYOGLOBIN in the last 168 hours.  Invalid input(s): CK ------------------------------------------------------------------------------------------------------------------ No results found for: BNP   Shon Hale M.D on 07/09/2017 at 5:27 PM  Between 7am to 7pm - Pager - [phone]  After 7pm go to [email or web address] - password [email or web address]  [facility] -  Office  [phone]   Voice Recognition Reubin Milan dictation system was used to create this note, attempts have been made to correct errors. Please contact the author with questions and/or clarifications.

## 2017-07-10 LAB — GLUCOSE, CAPILLARY
Glucose-Capillary: 150 mg/dL — ABNORMAL HIGH (ref 65–99)
Glucose-Capillary: 131 mg/dL — ABNORMAL HIGH (ref 65–99)
Glucose-Capillary: 129 mg/dL — ABNORMAL HIGH (ref 65–99)
Glucose-Capillary: 82 mg/dL (ref 65–99)

## 2017-07-10 LAB — C DIFFICILE QUICK SCREEN W PCR REFLEX
C Diff antigen: NEGATIVE
C Diff interpretation: NOT DETECTED
C Diff toxin: NEGATIVE

## 2017-07-10 LAB — URINE CULTURE: Culture: <10000 — AB

## 2017-07-10 MED ORDER — HALOPERIDOL LACTATE 5 MG/ML IJ SOLN: 5.0000 mg | Freq: Four times a day (QID) | INTRAMUSCULAR | Status: DC | PRN

## 2017-07-10 MED ORDER — HALOPERIDOL LACTATE 5 MG/ML IJ SOLN: 5.0000 mg | Freq: Once | INTRAMUSCULAR | Status: DC

## 2017-07-10 MED ORDER — OLANZAPINE 5 MG PO TBDP
10.0000 mg | ORAL_TABLET | Freq: Once | ORAL | Status: AC
  Administered 2017-07-10: 10 mg via ORAL
  Filled 2017-07-10: qty 2

## 2017-07-10 MED ORDER — QUETIAPINE FUMARATE 25 MG PO TABS
25.0000 mg | ORAL_TABLET | Freq: Every day | ORAL | Status: DC
  Administered 2017-07-10: 25 mg via ORAL
  Filled 2017-07-10: qty 1

## 2017-07-10 MED ORDER — LORAZEPAM 2 MG/ML IJ SOLN: 1.0000 mg | Freq: Four times a day (QID) | INTRAMUSCULAR | Status: DC | PRN

## 2017-07-10 NOTE — Progress Notes (Addendum)
CSW following to assist with disposition. [facility]. Per [facility], pt not at baseline as far as ambulation and ADL needs and family wants to pursue SNF (PT recommends also).  Pt 's passr request is in manual review- CSW faxed requested information, however pt cannot [facility] a SN[facility].  Spoke with pt's daughter Bonita QuinLinda re: lack of bed offers in area thus far- agreed to search in surrounding counties for SNF bed offers.   Will follow. No disposition plan confirmed as of yet.  Ilean SkillMeghan Cam Dauphin, MSW, LCSW Clinical Social Work 07/10/2017 [phone]  14:21- [facility]- spoke with daughter and she accepts on pt's behalf. Facility initiated insurance auth request.  PASSR still pending- have faxed requested documents.

## 2017-07-10 NOTE — Progress Notes (Signed)
Patient Demographics:    Veronica Rivas, is a 84 y.o. female, DOB - 11/26/1926, ZOX:[id number]  Admit date - 07/10/2017   Admitting Physician Delaine LameShrey C Purohit, MD  Outpatient Primary MD for the patient is Patient, No Pcp Per  LOS - 2   Chief Complaint  Patient presents with  . Fever        Subjective:    Veronica Rivas today has no fevers, no emesis,  No chest pain, occasional episodes of confusion and restlessness  Assessment  & Plan :    Principal Problem:   Influenza A Active Problems:   Dementia with behavioral disturbance   Sepsis (HCC)   HTN (hypertension)   HLD (hyperlipidemia)   T2DM (type 2 diabetes mellitus) (HCC)   Veronica Rivas is a 84 y.o. female with medical history significant of dementia, seizure disorder or possible conversion disorder with seizures, DM2 on oral glycemic's, hypertension, hyperlipidemia  Admitted on 07/04/2017  memory care facility [facility] with decreased appetite, altered mental status, fever and Sirs.  Patient tested positive for influenza A   Plan:- 1)Influenza A-from a respiratory standpoint improving, c/n Tamiflu as ordered, c/n  bronchodilators , mucolytics and supportive care  2)FEN- encourage increased oral intake  3)DM- Use Novolog/Humalog Sliding scale insulin with Accu-Cheks/Fingersticks as ordered   4)Generalized weakness/Debility- PT/OT recommends skilled nursing facility rehab  5)SIRs-secondary to #1 above, leukocytosis is resolved, cultures negative so far, stopped Vanco and cefepime, no evidence of bacterial infection at this time  6)Disposition-patient will require PASSR prior to being admitted to skilled nursing facility due to underlying psychiatric/cognitive concerns  7) dementia with behavioral disturbance-okay to use as needed Haldol, start Seroquel 25 mg nightly,continue Zoloft  8)HTN-continue Lisinopril and  metoprolol  Code Status : DNR   Disposition Plan  : SNF  Consults  :  PT/OT/SW  DVT Prophylaxis  :  Lovenox   Lab Results  Component Value Date   PLT 157 07/09/2017    Inpatient Medications  Scheduled Meds: . chlorhexidine  15 mL Mouth Rinse BID  . donepezil  10 mg Oral QHS  . enoxaparin (LOVENOX) injection  40 mg Subcutaneous QHS  . insulin aspart  0-9 Units Subcutaneous TID WC  . lisinopril  20 mg Oral Daily  . mouth rinse  15 mL Mouth Rinse q12n4p  . metoprolol tartrate  100 mg Oral BID  . OLANZapine zydis  10 mg Oral Once  . oseltamivir  30 mg Oral BID  . sertraline  100 mg Oral QHS  . simvastatin  10 mg Oral Daily  . cyanocobalamin  1,000 mcg Oral Daily   Continuous Infusions:  PRN Meds:.acetaminophen **OR** acetaminophen, food thickener, haloperidol lactate, LORazepam, ondansetron **OR** ondansetron (ZOFRAN) IV, polyethylene glycol    Anti-infectives (From admission, onward)   Start     Dose/Rate Route Frequency Ordered Stop   07/10/17 1800  vancomycin (VANCOCIN) IVPB 1000 mg/200 mL premix  Status:  Discontinued     1,000 mg 200 mL/hr over 60 Minutes Intravenous Every 48 hours 07/13/2017 2250 07/09/17 1114   07/09/17 2000  vancomycin (VANCOCIN) IVPB 750 mg/150 ml premix  Status:  Discontinued     750 mg 150 mL/hr over 60 Minutes Intravenous Every 24 hours 06/26/2017  2028 Jul 14, 2017 2247   07/09/17 1600  ceFEPIme (MAXIPIME) 1 g in sodium chloride 0.9 % 100 mL IVPB  Status:  Discontinued     1 g 200 mL/hr over 30 Minutes Intravenous Every 24 hours 14-Jul-2017 2217 2017-07-14 2242   07/09/17 1230  oseltamivir (TAMIFLU) capsule 30 mg     30 mg Oral 2 times daily 07/09/17 1114 07/14/17 0959   14-Jul-2017 2245  ceFEPIme (MAXIPIME) 1 g in sodium chloride 0.9 % 100 mL IVPB  Status:  Discontinued     1 g 200 mL/hr over 30 Minutes Intravenous Daily at bedtime 07/14/17 2243 07/09/17 1735   07/14/2017 2230  vancomycin (VANCOCIN) 1,500 mg in sodium chloride 0.9 % 500 mL IVPB  Status:   Discontinued     1,500 mg 250 mL/hr over 120 Minutes Intravenous Every 24 hours 2017/07/14 2217 07/14/17 2227   07-14-17 1915  vancomycin (VANCOCIN) IVPB 1000 mg/200 mL premix     1,000 mg 200 mL/hr over 60 Minutes Intravenous  Once Jul 14, 2017 1911 07/14/17 2106   Jul 14, 2017 1700  cefTRIAXone (ROCEPHIN) 1 g in sodium chloride 0.9 % 100 mL IVPB     1 g 200 mL/hr over 30 Minutes Intravenous  Once July 14, 2017 1649 July 14, 2017 1742        Objective:   Vitals:   07/09/17 2112 07/10/17 0409 07/10/17 0900 07/10/17 1300  BP: 122/69 (!) 144/74    Pulse: 60 61 76 65  Resp: 12 10  16   Temp: 98 F (36.7 C) 98.2 F (36.8 C)    TempSrc: Axillary Axillary    SpO2: 100% 100% 98% 98%  Weight:      Height:        Wt Readings from Last 3 Encounters:  2017/07/14 72.3 kg (159 lb 6.3 oz)     Intake/Output Summary (Last 24 hours) at 07/10/2017 1616 Last data filed at 07/10/2017 0955 Gross per 24 hour  Intake 0 ml  Output 950 ml  Net -950 ml     Physical Exam  Gen:- Awake Alert,  In no apparent distress  HEENT:- [place].AT, No sclera icterus Neck-Supple Neck,No JVD,.  Lungs-scattered wheezes bilaterally and movement is fair CV- S1, S2 normal Abd-  +ve B.Sounds, Abd Soft, No tenderness,    Extremity/Skin:- No  edema,    Psych-flat affect    Data Review:   Micro Results Recent Results (from the past 240 hour(s))  Blood Culture (routine x 2)     Status: None (Preliminary result)   Collection Time: 2017-07-14  3:55 PM  Result Value Ref Range Status   Specimen Description   Final    BLOOD LEFT ANTECUBITAL Performed at [facility], [address] [place], [place] [address]    Special Requests   Final    BOTTLES DRAWN AEROBIC AND ANAEROBIC Blood Culture adequate volume Performed at [facility], [address] [place], [place] [address]    Culture   Final    NO GROWTH 2 DAYS Performed at [facility], [address] [place], [place] [address]    Report Status  PENDING  Incomplete  Urine culture     Status: Abnormal   Collection Time: 07-14-2017  3:55 PM  Result Value Ref Range Status   Specimen Description   Final    URINE, RANDOM Performed at [facility], [address] [place], [place] [address]    Special Requests   Final    NONE Performed at  [facility], [address] [place], [place] [address]    Culture (A)  Final    <10,000 COLONIES/mL INSIGNIFICANT GROWTH Performed at [facility], [address] [place], [place] [address]    Report Status 07/10/2017 FINAL  Final  MRSA PCR Screening     Status: None   Collection Time: 04-Aug-2017 10:16 PM  Result Value Ref Range Status   MRSA by PCR NEGATIVE NEGATIVE Final    Comment:        The GeneXpert MRSA Assay (FDA approved for NASAL specimens only), is one component of a comprehensive MRSA colonization surveillance program. It is not intended to diagnose MRSA infection nor to guide or monitor treatment for MRSA infections. Performed at [facility], [address] [place], [place] [address]   C difficile quick screen w PCR reflex     Status: None   Collection Time: 07/10/17  3:20 AM  Result Value Ref Range Status   C Diff antigen NEGATIVE NEGATIVE Final   C Diff toxin NEGATIVE NEGATIVE Final   C Diff interpretation No C. difficile detected.  Final    Comment: Performed at [facility], [address] [place], [place] [address]    Radiology Reports X-ray Chest Pa And Lateral  Result Date: 07/09/2017 CLINICAL DATA:  Fever. EXAM: CHEST  2 VIEW COMPARISON:  Radiograph of 08/04/17. FINDINGS: Stable cardiomediastinal silhouette. Atherosclerosis of thoracic aorta is noted. No pneumothorax or significant pleural effusion is noted. Stable left basilar scarring is noted. No acute pulmonary disease is noted. No definite fracture is noted in the thoracic spine. IMPRESSION: Aortic atherosclerosis. Stable left  basilar scarring. No acute cardiopulmonary abnormality seen. Increased sclerosis of T12 vertebral body noted on prior exam is somewhat appreciated on this exam. As noted on prior exam, further evaluation with thoracic spine radiographs or thoracic spine MRI with and without IV contrast is recommended. Electronically Signed   By: Lupita Raider, M.D.   On: 07/09/2017 08:25   Dg Chest 2 View  Result Date: 06/21/2017 CLINICAL DATA:  Altered mental status.  Hypertension. EXAM: CHEST  2 VIEW COMPARISON:  04/21/2017 FINDINGS: Lungs are adequately inflated and otherwise clear. Cardiomediastinal silhouette is within normal. There is calcified plaque over the thoracic aorta. Mild degenerate change of the spine. IMPRESSION: No active cardiopulmonary disease. Electronically Signed   By: Elberta Fortis M.D.   On: 06/21/2017 18:31   Ct Head Wo Contrast  Result Date: 06/21/2017 CLINICAL DATA:  Four falls in the last 24 hours. EXAM: CT HEAD WITHOUT CONTRAST TECHNIQUE: Contiguous axial images were obtained from the base of the skull through the vertex without intravenous contrast. COMPARISON:  None. FINDINGS: Brain: No subdural, epidural, or subarachnoid hemorrhage. Mild prominence of the ventricles and sulci is likely due to atrophy. The cerebellum, brainstem, and basal cisterns are normal. No mass effect or midline shift. White matter changes are identified. No evidence of acute cortical ischemia or infarct. Vascular: Dense calcified atherosclerosis in the intracranial portions of the carotid arteries. Skull: Normal. Negative for fracture or focal lesion. Sinuses/Orbits: No acute finding. Other: None. IMPRESSION: No acute abnormality.  Chronic white matter changes. Electronically Signed   By: Gerome Sam III M.D   On: 06/21/2017 18:38   Dg Chest Port 1 View  Result Date: 04-Aug-2017 CLINICAL DATA:  Fever EXAM: PORTABLE CHEST 1 VIEW COMPARISON:  06/21/2017 chest radiograph. FINDINGS: Stable cardiomediastinal  silhouette with normal heart size. No pneumothorax. No  pleural effusion. No pulmonary edema. No acute consolidative airspace disease. Stable minimal scarring at the left lung base. Suggestion of increased sclerosis in the T12 vertebral body. IMPRESSION: No acute cardiopulmonary disease.  Stable left basilar scarring. Suggestion of increased sclerosis in the T12 vertebral body. Consider further evaluation with dedicated thoracic spine radiographs or thoracic spine MRI without and with IV contrast. Electronically Signed   By: Delbert Phenix M.D.   On: 07/03/2017 16:34     CBC Recent Labs  Lab 07/06/2017 1555 07/09/17 0433  WBC 12.0* 10.3  HGB 12.1 10.6*  HCT 38.6 33.7*  PLT PLATELET CLUMPS NOTED ON SMEAR, COUNT APPEARS ADEQUATE 157  MCV 97.7 96.8  MCH 30.6 30.5  MCHC 31.3 31.5  RDW 13.6 13.6  LYMPHSABS 1.0  --   MONOABS 2.4*  --   EOSABS 0.0  --   BASOSABS 0.0  --     Chemistries  Recent Labs  Lab 07/10/2017 1555 07/09/17 0433  NA 146* 145  K 3.7 3.1*  CL 106 110  CO2 28 26  GLUCOSE 172* 169*  BUN 31* 25*  CREATININE 1.50* 1.11*  CALCIUM 9.8 8.7*  AST 24  --   ALT 14  --   ALKPHOS 66  --   BILITOT 0.5  --    ------------------------------------------------------------------------------------------------------------------ No results for input(s): CHOL, HDL, LDLCALC, TRIG, CHOLHDL, LDLDIRECT in the last 72 hours.  No results found for: HGBA1C ------------------------------------------------------------------------------------------------------------------ No results for input(s): TSH, T4TOTAL, T3FREE, THYROIDAB in the last 72 hours.  Invalid input(s): FREET3 ------------------------------------------------------------------------------------------------------------------ No results for input(s): VITAMINB12, FOLATE, FERRITIN, TIBC, IRON, RETICCTPCT in the last 72 hours.  Coagulation profile No results for input(s): INR, PROTIME in the last 168 hours.  No results for  input(s): DDIMER in the last 72 hours.  Cardiac Enzymes No results for input(s): CKMB, TROPONINI, MYOGLOBIN in the last 168 hours.  Invalid input(s): CK ------------------------------------------------------------------------------------------------------------------ No results found for: BNP   Shon Hale M.D on 07/10/2017 at 4:16 PM  Between 7am to 7pm - Pager - [phone]  After 7pm go to [email or web address] - password [email or web address]  [facility] -  Office  [phone]   Voice Recognition Reubin Milan dictation system was used to create this note, attempts have been made to correct errors. Please contact the author with questions and/or clarifications.

## 2017-07-10 NOTE — Progress Notes (Signed)
  Speech Language Pathology Treatment: Dysphagia  Patient Details Name: Veronica ParishDolores Sease MRN: [id number] DOB: [birth year] Today's Date: 07/10/2017 Time: 1025-1040 SLP Time Calculation (min) (ACUTE ONLY): 15 min  Assessment / Plan / Recommendation Clinical Impression  F/u after yesterday's clinical swallow assessment.  Pt alert, engaged with examiner despite confusion, able to exchange social communication.  Consumed thin liquids with better toleration today and no overt s/s of aspiration.  Improved attention to solid boluses with no notable pocketing.  Recommend advancing liquids back to thin; maintain dysphagia 3; give meds whole in puree.  No family available; discussed changes in diet with RN.  SLP will follow briefly pending D/C back to SNF.     HPI HPI: Clarene CritchleyDolores Milleris a 84 y.o.femalewith medical history significant ofdementia, seizure disorder or possible conversion disorder with seizures, type 2 diabetes mellitus, hypertension, hyperlipidemia who comes in from memory care facility with decreased appetite, altered mental status, fever and Sirs. Chest x-ray did not show any clear pneumonia      SLP Plan  Continue with current plan of care       Recommendations  Diet recommendations: Dysphagia 3 (mechanical soft);Thin liquid Liquids provided via: Cup Medication Administration: Whole meds with puree Supervision: Patient able to self feed;Staff to assist with self feeding Compensations: Slow rate;Small sips/bites;Minimize environmental distractions                Oral Care Recommendations: Oral care BID Follow up Recommendations: Skilled Nursing facility SLP Visit Diagnosis: Dysphagia, oropharyngeal phase (R13.12) Plan: Continue with current plan of care       GO                [name], [name] Laurice 07/10/2017, 10:45 AM

## 2017-07-13 LAB — CULTURE, BLOOD (ROUTINE X 2)
Culture: NO GROWTH
Special Requests: ADEQUATE

## 2017-07-20 NOTE — Progress Notes (Signed)
Patient was pronounced at 0415 by this nurse and Zadie RhineJeneba, RN. Responsible party Godfrey PickFries, Linda (daughter) was called and notified, she requested that patient be taken to the morgue and that she would contact her other sister for the next action. Organ donor contacted at 0445, patient unsuitable for donor. Body prepared and transported to the morgue with belongings: a pair of shoes, eye glasses and clothing.

## 2017-07-20 NOTE — Discharge Summary (Signed)
Death Summary  Veronica ParishDolores Rivas ZOX:[id number] DOB: 23-Apr-1927 DOA: 07/01/17   Admit date: 07/01/17 Date of Death: 06/22/2017  Final Diagnoses:  Principal Problem:   Influenza A Active Problems:   Dementia with behavioral disturbance   Sepsis (HCC)   HTN (hypertension)   HLD (hyperlipidemia)   T2DM (type 2 diabetes mellitus) (HCC)   Time: Patient was pronounced at 0415..... Patient was found unresponsive without pulse or spontaneous respiration by RN on 07/10/2017 around 0414 am   History of present illness:  Chief Complaint: Weakness, altered mental status  HPI: Veronica ParishDolores Rivas is a 84 y.o. female with medical history significant of dementia, seizure disorder or possible conversion disorder with seizures, type 2 diabetes mellitus on oral glycemic's, hypertension, hyperlipidemia who comes in from memory care facility to [facility] with decreased appetite, altered mental status, fever and Sirs.  Patient cannot provide any history due to her underlying dementia.  On review of the documentation and discussion with the patient's daughter Ms. Veronica Rivas the patient was doing well until approximately 6 weeks ago when she was moved to many memory care unit because she became progressively more agitated and was threatening to harm others and herself because her dog was taken away.  Since then she has been increasingly somnolent and has been on increasing amounts of medications for agitation.  The daughter reports that a few days ago she developed a cough as well as some congestion runny nose and she was prescribed Robitussin by the primary care physician.  Since then the daughter noted the patient became less and less interactive and responsive.  She appears to be significantly fatigued and sleeping all the time.  The daughter initially attributed this to polypharmacy.   ED Course: In the ED patient was noted to have a mildly elevated temperature to 100.2.  Her heart rate was 124 and her  blood pressures were 199-93.  She is mildly hypoxic and was placed on 2 L nasal cannula.  Labs are notable for a white count 12.0.  Creatinine was 1.5 and elevated BUN.  Her sodium was 146.  Lactate was 1.91.  UA was not suggestive of infection.  Chest x-ray did not show any clear pneumonia.  [facility].   Hospital Course:  Clarene CritchleyDolores Milleris a 84 y.o.femalewith medical history significant ofdementia, seizure disorder or possible conversion disorder with seizures, DM2 on oral glycemic's, hypertension, hyperlipidemia  Admitted on Sep 19, 2017  memory care facility to [facility] with decreased appetite, altered mental status, fever and Sirs.  Patient tested positive for influenza A   Plan:- 1)Influenza A-patient was treated for influenza A with Tamiflu, bronchodilators, mucolytics and supportive care, patient status actually improved, leukocytosis resolved, cultures remained negative, suddenly patient expired around 0415 on 07/10/2017  2)SIRs-secondary to #1 above, leukocytosis is resolved, cultures negative so far, stopped Vanco and cefepime, no evidence of bacterial infection at this time  3)Dementia with behavioral disturbance- had episodes of agitation, she was treated with benzos and antipsychotics  8)HTN-was treated with Lisinopril and metoprolol  Code Status : DNR     Time: Patient was pronounced at 0415..... Patient was found unresponsive without pulse or spontaneous respiration by RN on 06/30/2017 around 0414 am   Signed:  Radin Raptis  [facility] 07/12/2017, 4:25 PM

## 2017-07-20 NOTE — Progress Notes (Signed)
Patient found unresponsive, no respirations, heart rate or lungs sounds noted. Pt. Is a DNR. On call physician paged.

## 2017-07-20 NOTE — Progress Notes (Signed)
RN paged because pt expired. RN walked in to find pt unresponsive without pulse or respirations. Death certificate completed. KJKG, NP [facility]

## 2017-07-20 DEATH — deceased

## 2017-10-16 ENCOUNTER — Ambulatory Visit: Payer: Medicare HMO | Admitting: Neurology

## 2018-08-31 IMAGING — CT CT HEAD W/O CM
3 series · 15 of 47 positions shown, 18 images · non-contrast
Comparison: None.

CLINICAL DATA: Four falls in the last 24 hours.

EXAM:
CT HEAD WITHOUT CONTRAST
TECHNIQUE: Contiguous axial images were obtained from the base of the skull
through the vertex without intravenous contrast.

[Series 2: head wo · axial · 0.39mm/px · z∈[+688,+812]mm · 9 of 31 slices shown, 12 images]
[im 3/31  brain]
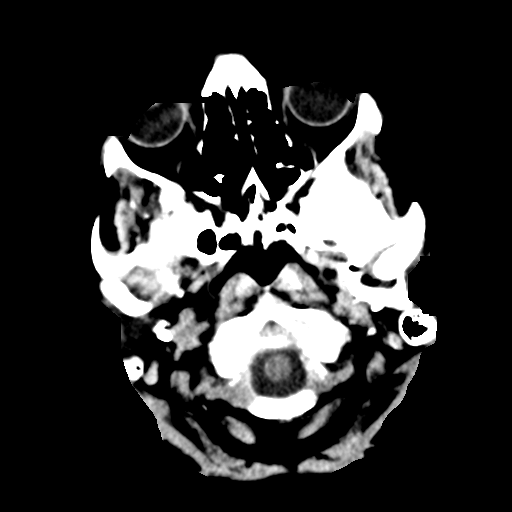
[im 3/31  bone]
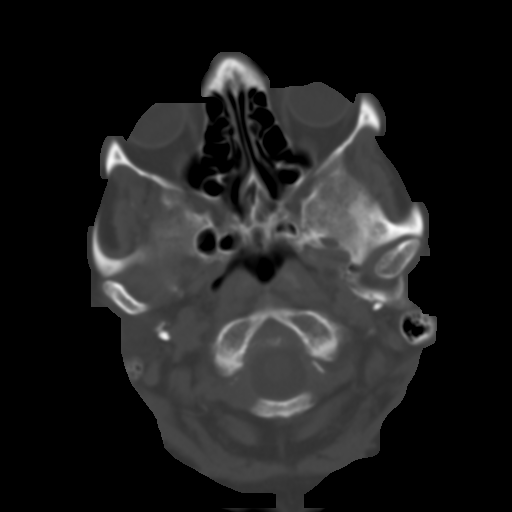
[im 6/31  brain]
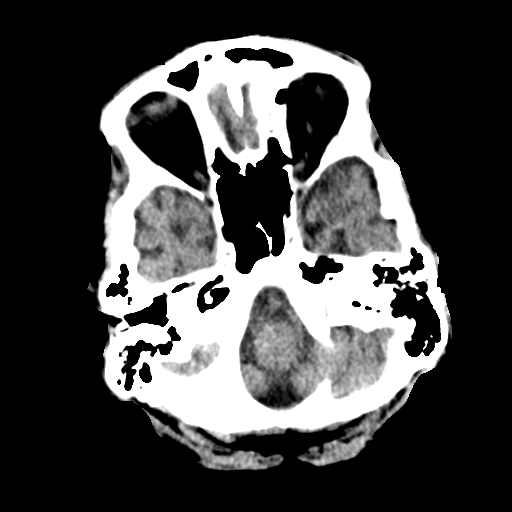
[im 9/31  brain]
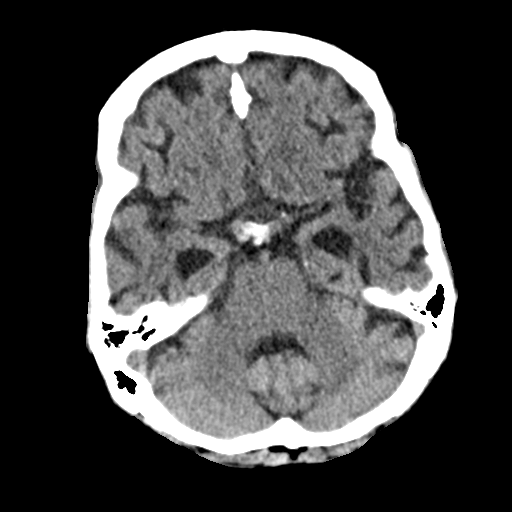
[im 12/31  brain]
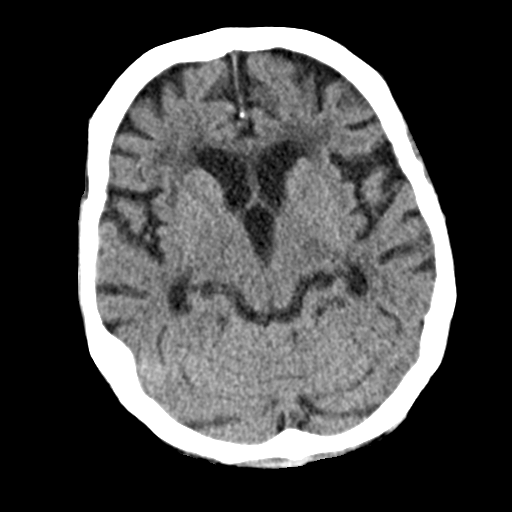
[im 16/31  brain]
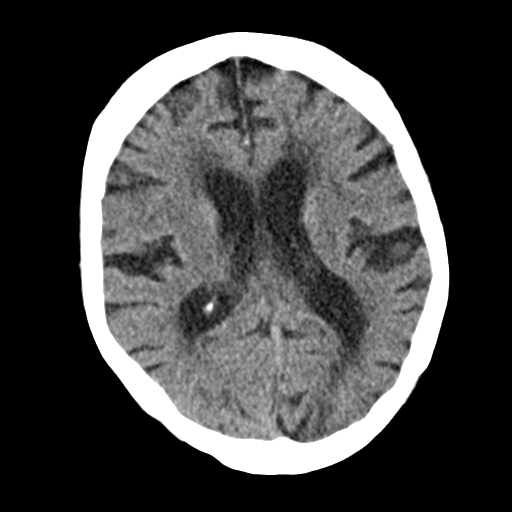
[im 16/31  bone]
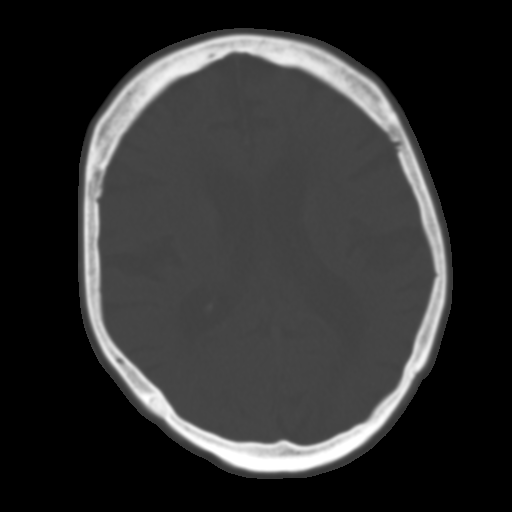
[im 19/31  brain]
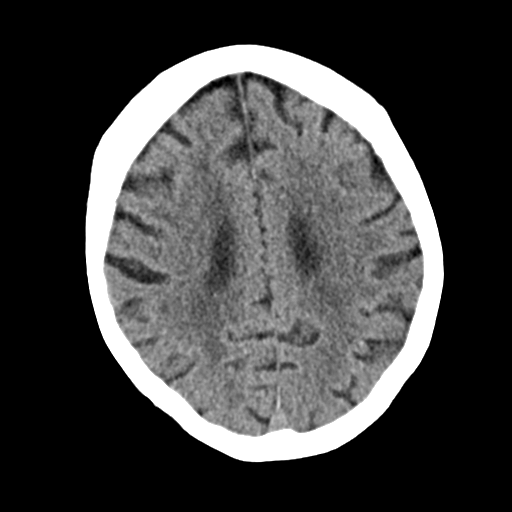
[im 22/31  brain]
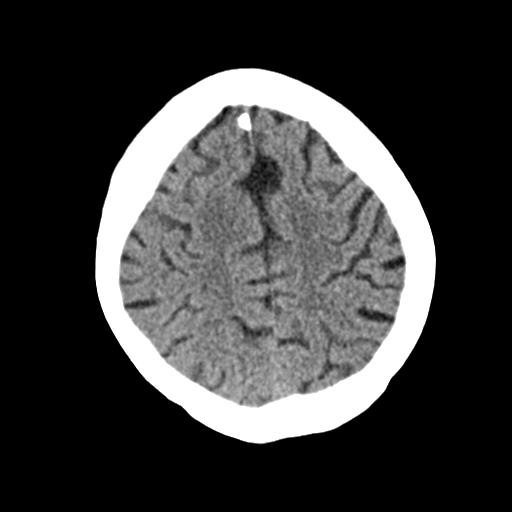
[im 25/31  brain]
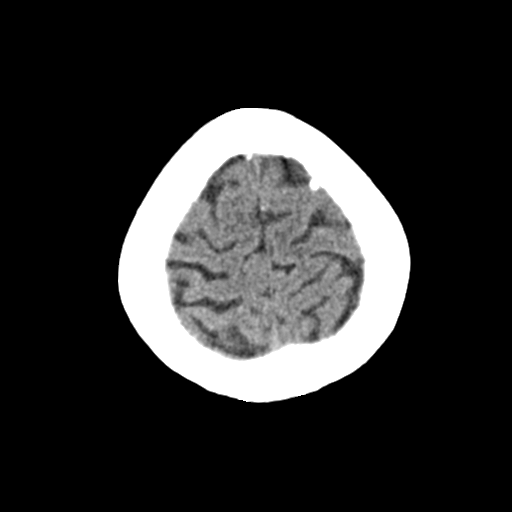
[im 28/31  brain]
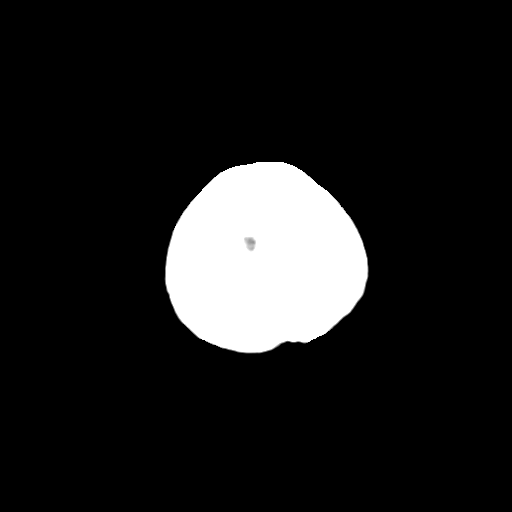
[im 28/31  bone]
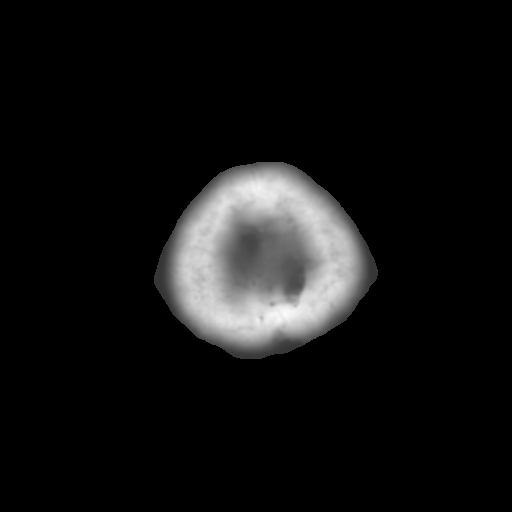

[Series 4: coronal soft · coronal · 0.29mm/px · 3 of 67 slices shown]
[im 23/67  brain]
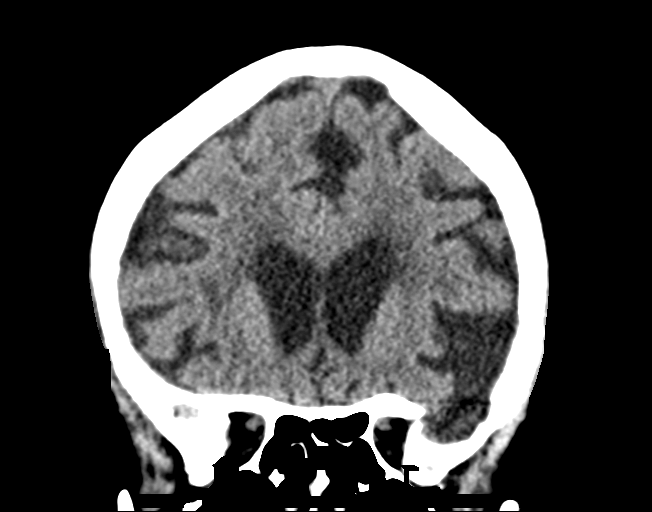
[im 30/67  brain]
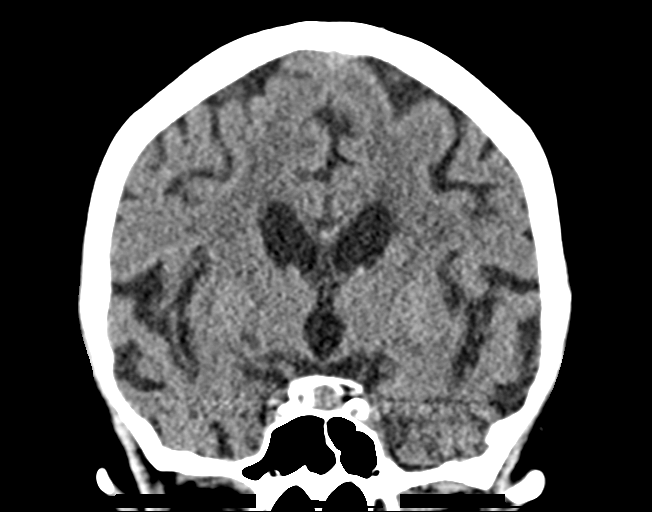
[im 37/67  brain]
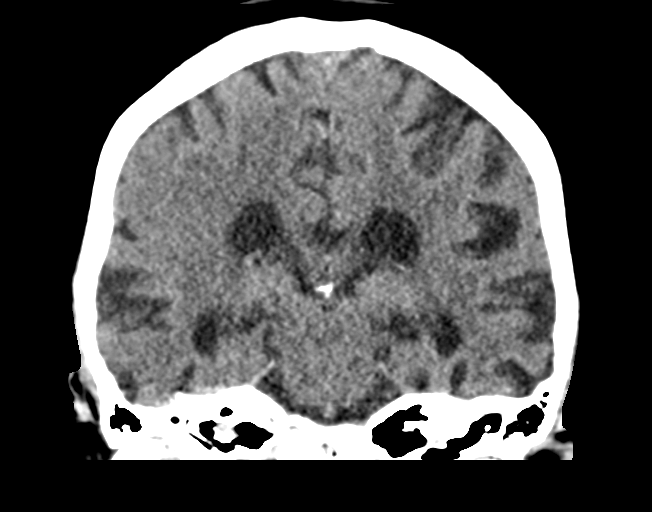

[Series 5: sag soft · sagittal · 0.29mm/px · 3 of 64 slices shown]
[im 22/64  brain]
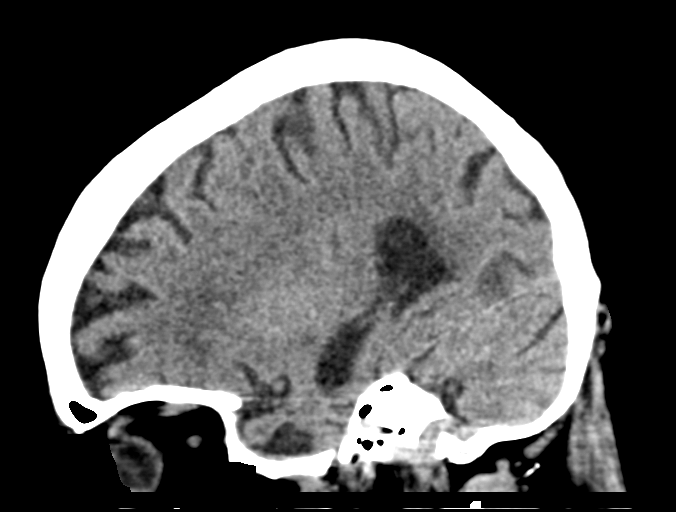
[im 32/64  brain]
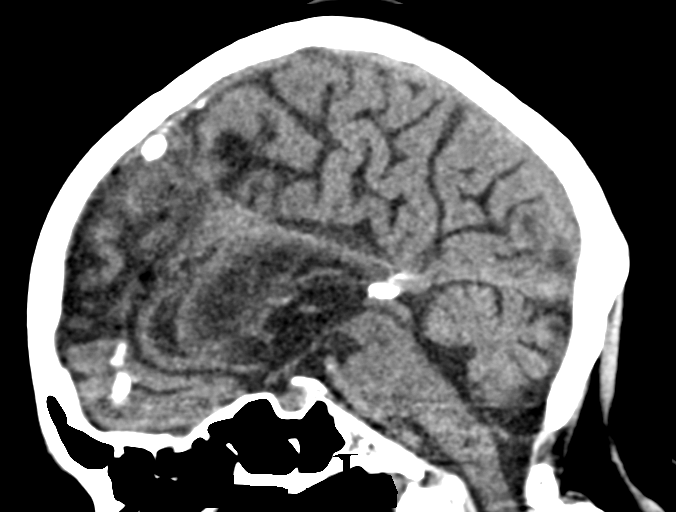
[im 43/64  brain]
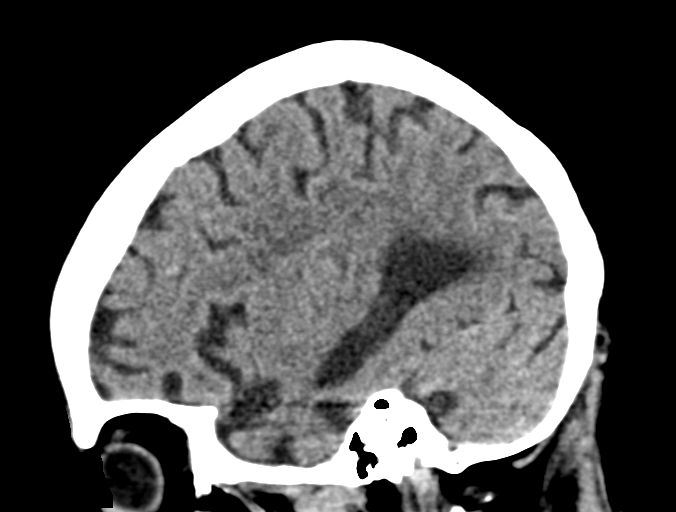

[15 of 47 positions shown; findings below may reference images not displayed]

FINDINGS: Brain: No subdural, epidural, or subarachnoid hemorrhage. Mild
prominence of the ventricles and sulci is likely due to atrophy. The
cerebellum, brainstem, and basal cisterns are normal. No mass effect
or midline shift. White matter changes are identified. No evidence
of acute cortical ischemia or infarct.

Vascular: Dense calcified atherosclerosis in the intracranial
portions of the carotid arteries.

Skull: Normal. Negative for fracture or focal lesion.

Sinuses/Orbits: No acute finding.

Other: None.
IMPRESSION: No acute abnormality.  Chronic white matter changes.

## 2018-09-17 IMAGING — DX DG CHEST 1V PORT
1 series · 1 of 1 positions shown · non-contrast
Comparison: 06/21/2017 chest radiograph.

CLINICAL DATA: Fever

EXAM:
PORTABLE CHEST 1 VIEW

[chest ap]
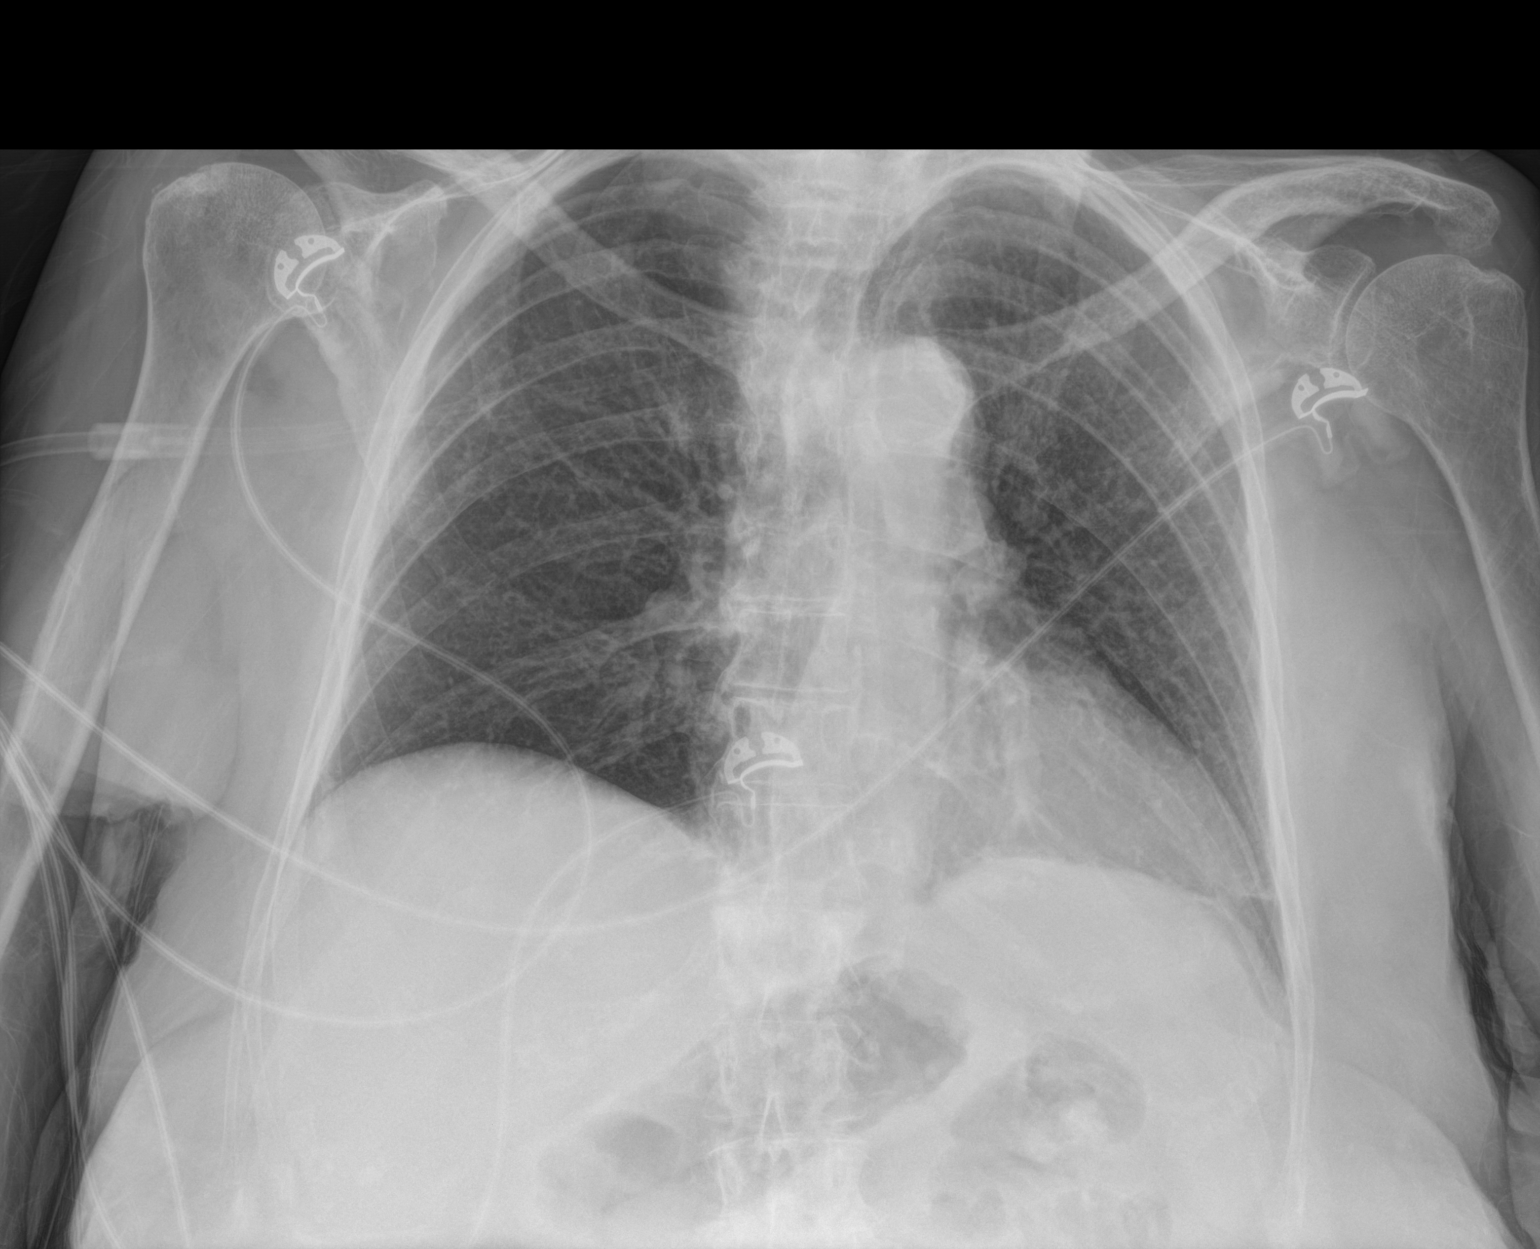

[1 of 1 positions shown; findings below may reference images not displayed]

FINDINGS: Stable cardiomediastinal silhouette with normal heart size. No
pneumothorax. No pleural effusion. No pulmonary edema. No acute
consolidative airspace disease. Stable minimal scarring at the left
lung base. Suggestion of increased sclerosis in the T12 vertebral
body.
IMPRESSION: No acute cardiopulmonary disease.  Stable left basilar scarring.

Suggestion of increased sclerosis in the T12 vertebral body.
Consider further evaluation with dedicated thoracic spine
radiographs or thoracic spine MRI without and with IV contrast.
